# Patient Record
Sex: Female | Born: 1975 | Race: White | Hispanic: No | Marital: Married | Smoking: Never smoker
Health system: Southern US, Community
[De-identification: ages and names within clinical notes are randomized; demographics above are authoritative.]

## PROBLEM LIST (undated history)

## (undated) DIAGNOSIS — F319 Bipolar disorder, unspecified: Secondary | ICD-10-CM

## (undated) DIAGNOSIS — K5090 Crohn's disease, unspecified, without complications: Secondary | ICD-10-CM

## (undated) DIAGNOSIS — N200 Calculus of kidney: Secondary | ICD-10-CM

## (undated) HISTORY — PX: APPENDECTOMY: SHX54

---

## 1998-05-31 ENCOUNTER — Emergency Department (HOSPITAL_COMMUNITY): Admission: EM | Admit: 1998-05-31 | Discharge: 1998-05-31 | Payer: Self-pay | Admitting: Emergency Medicine

## 1998-05-31 ENCOUNTER — Encounter: Payer: Self-pay | Admitting: Emergency Medicine

## 1999-06-27 ENCOUNTER — Encounter: Payer: Self-pay | Admitting: Emergency Medicine

## 1999-06-27 ENCOUNTER — Emergency Department (HOSPITAL_COMMUNITY): Admission: EM | Admit: 1999-06-27 | Discharge: 1999-06-27 | Payer: Self-pay | Admitting: Emergency Medicine

## 2000-12-27 ENCOUNTER — Other Ambulatory Visit: Admission: RE | Admit: 2000-12-27 | Discharge: 2000-12-27 | Payer: Self-pay | Admitting: Obstetrics and Gynecology

## 2001-02-12 ENCOUNTER — Other Ambulatory Visit: Admission: RE | Admit: 2001-02-12 | Discharge: 2001-02-12 | Payer: Self-pay | Admitting: Obstetrics and Gynecology

## 2002-02-14 ENCOUNTER — Encounter (INDEPENDENT_AMBULATORY_CARE_PROVIDER_SITE_OTHER): Payer: Self-pay | Admitting: Specialist

## 2002-02-14 ENCOUNTER — Ambulatory Visit (HOSPITAL_COMMUNITY): Admission: RE | Admit: 2002-02-14 | Discharge: 2002-02-14 | Payer: Self-pay | Admitting: Gastroenterology

## 2010-03-04 ENCOUNTER — Emergency Department (HOSPITAL_BASED_OUTPATIENT_CLINIC_OR_DEPARTMENT_OTHER)
Admission: EM | Admit: 2010-03-04 | Discharge: 2010-03-04 | Payer: Self-pay | Source: Home / Self Care | Admitting: Emergency Medicine

## 2010-07-22 NOTE — Op Note (Signed)
NAMEDAJAHNAE, Laura Hebert                        ACCOUNT NO.:  [id number]   MEDICAL RECORD NO.:  [id number]                   PATIENT TYPE:  AMB   LOCATION:  ENDO                                 FACILITY:  [facility]   PHYSICIAN:  Petra Kuba, M.D.                 DATE OF BIRTH:  [birth year]   DATE OF PROCEDURE:  02/14/2002  DATE OF DISCHARGE:                                 OPERATIVE REPORT   PROCEDURE:  Colonoscopy.   INDICATIONS FOR PROCEDURE:  Crohns disease with some increased symptoms of  late. Want to reevaluate it.   Consent was signed after risks, benefits, methods, and options were  thoroughly discussed in the office.   MEDICINES USED:  Demerol 90, Versed 9.   DESCRIPTION OF PROCEDURE:  Rectal inspection was pertinent for external  hemorrhoids, small. Digital exam was negative. The pediatric video  adjustable colonoscope was inserted, easily advanced around the colon to the  cecum. This did require some lower abdominal pressure but no position  changes. On insertion beginning in the mid sigmoid, there was some  occasional scattered erosions and ulcers with some decreased vascular  pattern on the left side mostly in the descending but no other significant  findings. The cecum was identified by the appendiceal orifice the ileocecal  valve. In fact, the scope was inserted a short ways in the terminal ileum  which was normal. Photo documentation was obtained. The scope was slowly  withdrawn. The prep was adequate. There was some liquid stool that required  washing and suctioning on slow. A few TI scattered biopsies were obtained  and put in the first container, the scope was slowly withdrawn. There was  rare patches of erosions or ulcers primarily in the descending and sigmoid  but an occasional one in the ascending and transverse and multiple biopsies  were obtained. Also the slight decreased vascular pattern was confirmed in  the descending. No other significant abnormalities  were seen. The distal  sigmoid and rectum were normal. A few scattered biopsies of that area was  obtained and put in the third container. The scope was retroflexed pertinent  for some internal hemorrhoids. The scope was straightened and readvanced a  short ways up the left side of the colon, air was suctioned, scope removed.  The patient tolerated the procedure well. There was no obvious or immediate  complications.   ENDOSCOPIC DIAGNOSIS:  1. Internal and external small hemorrhoids.  2. Mild scattered erosions and ulcers throughout the mid sigmoid to the     ascending with decreased vascular pattern in the descending as well     status post multiple biopsies.  3. Otherwise within normal limits to the terminal ileum status post biopsy     with normal rectal and distal sigmoid status post biopsy.   PLAN:  Await pathology, increase Asacol to 3, three times a day. Call p.r.n.  Small bowel follow through probably  next p.r.n. and follow-up p.r.n. or in  three months to recheck symptoms and possibly retry lowering the dose.                                               Petra Kuba, M.D.    MEM/MEDQ  D:  02/14/2002  T:  02/14/2002  Job:  [id number]

## 2012-05-10 ENCOUNTER — Encounter (HOSPITAL_BASED_OUTPATIENT_CLINIC_OR_DEPARTMENT_OTHER): Payer: Self-pay | Admitting: Emergency Medicine

## 2012-05-10 ENCOUNTER — Emergency Department (HOSPITAL_BASED_OUTPATIENT_CLINIC_OR_DEPARTMENT_OTHER)
Admission: EM | Admit: 2012-05-10 | Discharge: 2012-05-10 | Disposition: A | Discharge status: Home / Self Care | Payer: Managed Care, Other (non HMO) | Attending: Emergency Medicine | Admitting: Emergency Medicine

## 2012-05-10 ENCOUNTER — Emergency Department (HOSPITAL_BASED_OUTPATIENT_CLINIC_OR_DEPARTMENT_OTHER): Payer: Managed Care, Other (non HMO)

## 2012-05-10 DIAGNOSIS — Z8719 Personal history of other diseases of the digestive system: Secondary | ICD-10-CM | POA: Insufficient documentation

## 2012-05-10 DIAGNOSIS — J3489 Other specified disorders of nose and nasal sinuses: Secondary | ICD-10-CM | POA: Insufficient documentation

## 2012-05-10 DIAGNOSIS — J40 Bronchitis, not specified as acute or chronic: Secondary | ICD-10-CM | POA: Insufficient documentation

## 2012-05-10 DIAGNOSIS — J069 Acute upper respiratory infection, unspecified: Secondary | ICD-10-CM | POA: Insufficient documentation

## 2012-05-10 HISTORY — DX: Crohn's disease, unspecified, without complications: K50.90

## 2012-05-10 MED ORDER — GUAIFENESIN-CODEINE 100-10 MG/5ML PO SYRP: 5.0000 mL | ORAL_SOLUTION | Freq: Three times a day (TID) | ORAL | Status: DC | PRN

## 2012-05-10 NOTE — ED Provider Notes (Signed)
History     CSN: [id number]  Arrival date & time 05/10/12  1623   First MD Initiated Contact with Patient 05/10/12 1642      Chief Complaint  Patient presents with  . Cough    (Consider location/radiation/quality/duration/timing/severity/associated sxs/prior treatment) HPI Comments: No leg pain or swelling.  No pleuritic pain.  No fevers.  Patient is a 37 y.o. female presenting with cough. The history is provided by the patient.  Cough Cough characteristics:  Non-productive, dry and hacking Severity:  Moderate Onset quality:  Gradual Duration:  1 week Timing:  Intermittent Progression:  Worsening Chronicity:  New Smoker: no   Context: upper respiratory infection   Context: not fumes and not sick contacts   Relieved by:  Nothing Worsened by:  Nothing tried Ineffective treatments: Nyquil. Associated symptoms: rhinorrhea and sinus congestion   Associated symptoms: no chest pain, no chills, no diaphoresis, no fever, no headaches, no rash and no shortness of breath     Past Medical History  Diagnosis Date  . Crohn's disease     Past Surgical History  Procedure Laterality Date  . Cesarean section    . Appendectomy      No family history on file.  History  Substance Use Topics  . Smoking status: Never Smoker   . Smokeless tobacco: Not on file  . Alcohol Use: Yes     Comment: socially    OB History   Grav Para Term Preterm Abortions TAB SAB Ect Mult Living                  Review of Systems  Constitutional: Negative for fever, chills, diaphoresis and fatigue.  HENT: Positive for rhinorrhea. Negative for congestion and sneezing.   Eyes: Negative.   Respiratory: Positive for cough. Negative for chest tightness and shortness of breath.   Cardiovascular: Negative for chest pain and leg swelling.  Gastrointestinal: Negative for nausea, vomiting, abdominal pain, diarrhea and blood in stool.  Genitourinary: Negative for frequency, hematuria, flank pain and  difficulty urinating.  Musculoskeletal: Negative for back pain and arthralgias.  Skin: Negative for rash.  Neurological: Negative for dizziness, speech difficulty, weakness, numbness and headaches.    Allergies  Penicillins  Home Medications   Current Outpatient Rx  Name  Route  Sig  Dispense  Refill  . guaiFENesin-codeine (ROBITUSSIN AC) 100-10 MG/5ML syrup   Oral   Take 5 mLs by mouth 3 (three) times daily as needed for cough.   120 mL   0     BP 139/85  Pulse 86  Temp(Src) 97.6 F (36.4 C) (Oral)  Ht 5\' 2"  (1.575 m)  Wt 162 lb (73.483 kg)  BMI 29.62 kg/m2  SpO2 97%  LMP 04/23/2012  Physical Exam  Constitutional: She is oriented to person, place, and time. She appears well-developed and well-nourished.  HENT:  Head: Normocephalic and atraumatic.  Right Ear: External ear normal.  Left Ear: External ear normal.  Mouth/Throat: Oropharynx is clear and moist.  Eyes: Pupils are equal, round, and reactive to light.  Neck: Normal range of motion. Neck supple.  Cardiovascular: Normal rate, regular rhythm and normal heart sounds.   Pulmonary/Chest: Effort normal. No respiratory distress. She has no wheezes. She has rales (few crackles right base). She exhibits no tenderness.  Abdominal: Soft. Bowel sounds are normal. There is no tenderness. There is no rebound and no guarding.  Musculoskeletal: Normal range of motion. She exhibits no edema.  Lymphadenopathy:    She has no cervical  adenopathy.  Neurological: She is alert and oriented to person, place, and time.  Skin: Skin is warm and dry. No rash noted.  Psychiatric: She has a normal mood and affect.    ED Course  Procedures (including critical care time)  No results found for this or any previous visit. Dg Chest 2 View  05/10/2012  *RADIOLOGY REPORT*  Clinical Data: Cough, congestion  CHEST - 2 VIEW  Comparison: None.  Findings: Lungs are clear. No pleural effusion or pneumothorax.  Cardiomediastinal silhouette is  within normal limits.  Visualized osseous structures are within normal limits.  IMPRESSION: Normal chest radiographs.   Original Report Authenticated By: Charline Bills, M.D.       1. Bronchitis       MDM  Patient with a one-week history of a nonproductive cough. She is well-appearing with new shortness of breath or hypoxia. She has no tachypnea and no evidence of pulmonary embolus. Her lungs were clear other than some few crackles in the right lung base which seem to clear with coughing. Her chest x-ray is negative for infiltrates. I feel this is likely a viral bronchitis. I did give her a prescription for Robitussin with codeine. I advised her to followup with her primary care physician or return to the emergency room if her symptoms worsen or are not improving.       Rolan Bucco, MD 05/10/12 1736

## 2012-05-10 NOTE — ED Notes (Signed)
NP cough x one week.  No n/v/d or fever.  Has taken Nyquil OTC without relief.

## 2013-08-06 DIAGNOSIS — F411 Generalized anxiety disorder: Secondary | ICD-10-CM | POA: Diagnosis present

## 2014-09-30 DIAGNOSIS — F329 Major depressive disorder, single episode, unspecified: Secondary | ICD-10-CM | POA: Insufficient documentation

## 2017-06-14 ENCOUNTER — Emergency Department (HOSPITAL_BASED_OUTPATIENT_CLINIC_OR_DEPARTMENT_OTHER): Payer: Managed Care, Other (non HMO)

## 2017-06-14 ENCOUNTER — Encounter (HOSPITAL_BASED_OUTPATIENT_CLINIC_OR_DEPARTMENT_OTHER): Payer: Self-pay | Admitting: *Deleted

## 2017-06-14 ENCOUNTER — Emergency Department (HOSPITAL_BASED_OUTPATIENT_CLINIC_OR_DEPARTMENT_OTHER)
Admission: EM | Admit: 2017-06-14 | Discharge: 2017-06-14 | Disposition: A | Discharge status: Home / Self Care | Payer: Managed Care, Other (non HMO) | Attending: Emergency Medicine | Admitting: Emergency Medicine

## 2017-06-14 ENCOUNTER — Other Ambulatory Visit: Payer: Self-pay

## 2017-06-14 DIAGNOSIS — Z79899 Other long term (current) drug therapy: Secondary | ICD-10-CM | POA: Insufficient documentation

## 2017-06-14 DIAGNOSIS — R197 Diarrhea, unspecified: Secondary | ICD-10-CM | POA: Insufficient documentation

## 2017-06-14 DIAGNOSIS — R109 Unspecified abdominal pain: Secondary | ICD-10-CM | POA: Diagnosis present

## 2017-06-14 DIAGNOSIS — N201 Calculus of ureter: Secondary | ICD-10-CM

## 2017-06-14 DIAGNOSIS — R112 Nausea with vomiting, unspecified: Secondary | ICD-10-CM | POA: Diagnosis not present

## 2017-06-14 DIAGNOSIS — N132 Hydronephrosis with renal and ureteral calculous obstruction: Secondary | ICD-10-CM | POA: Insufficient documentation

## 2017-06-14 LAB — COMPREHENSIVE METABOLIC PANEL
ALBUMIN: 3.6 g/dL (ref 3.5–5.0)
ALK PHOS: 96 U/L (ref 38–126)
ALT: 8 U/L — ABNORMAL LOW (ref 14–54)
ANION GAP: 8 (ref 5–15)
AST: 14 U/L — AB (ref 15–41)
BUN: 10 mg/dL (ref 6–20)
CALCIUM: 8.3 mg/dL — AB (ref 8.9–10.3)
CO2: 20 mmol/L — AB (ref 22–32)
Chloride: 108 mmol/L (ref 101–111)
Creatinine, Ser: 0.91 mg/dL (ref 0.44–1.00)
GFR calc Af Amer: >60 mL/min (ref >60)
GFR calc non Af Amer: >60 mL/min (ref >60)
Glucose, Bld: 124 mg/dL — ABNORMAL HIGH (ref 65–99)
POTASSIUM: 3.3 mmol/L — AB (ref 3.5–5.1)
SODIUM: 136 mmol/L (ref 135–145)
Total Bilirubin: 0.1 mg/dL — ABNORMAL LOW (ref 0.3–1.2)
Total Protein: 7.2 g/dL (ref 6.5–8.1)

## 2017-06-14 LAB — URINALYSIS, ROUTINE W REFLEX MICROSCOPIC
BILIRUBIN URINE: NEGATIVE
Glucose, UA: NEGATIVE mg/dL
Ketones, ur: 15 mg/dL — AB
LEUKOCYTES UA: NEGATIVE
NITRITE: NEGATIVE
PH: 6.5 (ref 5.0–8.0)
PROTEIN: NEGATIVE mg/dL
Specific Gravity, Urine: 1.025 (ref 1.005–1.030)

## 2017-06-14 LAB — CBC WITH DIFFERENTIAL/PLATELET
BASOS ABS: 0 10*3/uL (ref 0.0–0.1)
BASOS PCT: 0 %
EOS ABS: 0.1 10*3/uL (ref 0.0–0.7)
Eosinophils Relative: 1 %
HCT: 30.7 % — ABNORMAL LOW (ref 36.0–46.0)
HEMOGLOBIN: 9.7 g/dL — AB (ref 12.0–15.0)
Lymphocytes Relative: 6 %
Lymphs Abs: 0.6 10*3/uL — ABNORMAL LOW (ref 0.7–4.0)
MCH: 24.9 pg — ABNORMAL LOW (ref 26.0–34.0)
MCHC: 31.6 g/dL (ref 30.0–36.0)
MCV: 78.7 fL (ref 78.0–100.0)
MONOS PCT: 4 %
Monocytes Absolute: 0.4 10*3/uL (ref 0.1–1.0)
NEUTROS PCT: 89 %
Neutro Abs: 8.6 10*3/uL — ABNORMAL HIGH (ref 1.7–7.7)
Platelets: 340 10*3/uL (ref 150–400)
RBC: 3.9 MIL/uL (ref 3.87–5.11)
RDW: 14.9 % (ref 11.5–15.5)
WBC: 9.6 10*3/uL (ref 4.0–10.5)

## 2017-06-14 LAB — LIPASE, BLOOD: Lipase: 27 U/L (ref 11–51)

## 2017-06-14 LAB — PREGNANCY, URINE: PREG TEST UR: NEGATIVE

## 2017-06-14 LAB — I-STAT CG4 LACTIC ACID, ED: Lactic Acid, Venous: 1.27 mmol/L (ref 0.5–1.9)

## 2017-06-14 LAB — URINALYSIS, MICROSCOPIC (REFLEX)

## 2017-06-14 MED ORDER — HYDROMORPHONE HCL 1 MG/ML IJ SOLN
1.0000 mg | Freq: Once | INTRAMUSCULAR | Status: AC
  Administered 2017-06-14: 1 mg via INTRAVENOUS
  Filled 2017-06-14: qty 1

## 2017-06-14 MED ORDER — ONDANSETRON HCL 4 MG/2ML IJ SOLN
4.0000 mg | Freq: Once | INTRAMUSCULAR | Status: AC
  Administered 2017-06-14: 4 mg via INTRAVENOUS
  Filled 2017-06-14: qty 2

## 2017-06-14 MED ORDER — IOPAMIDOL (ISOVUE-300) INJECTION 61%
100.0000 mL | Freq: Once | INTRAVENOUS | Status: AC | PRN
  Administered 2017-06-14: 100 mL via INTRAVENOUS

## 2017-06-14 MED ORDER — MORPHINE SULFATE (PF) 4 MG/ML IV SOLN
4.0000 mg | Freq: Once | INTRAVENOUS | Status: AC
  Administered 2017-06-14: 4 mg via INTRAVENOUS
  Filled 2017-06-14: qty 1

## 2017-06-14 MED ORDER — TAMSULOSIN HCL 0.4 MG PO CAPS: 0.4000 mg | ORAL_CAPSULE | Freq: Every day | ORAL | 0 refills | Status: DC

## 2017-06-14 MED ORDER — ONDANSETRON 4 MG PO TBDP: 4.0000 mg | ORAL_TABLET | Freq: Three times a day (TID) | ORAL | 0 refills | Status: DC | PRN

## 2017-06-14 MED ORDER — IBUPROFEN 800 MG PO TABS: 800.0000 mg | ORAL_TABLET | Freq: Three times a day (TID) | ORAL | 0 refills | Status: DC

## 2017-06-14 MED ORDER — KETOROLAC TROMETHAMINE 30 MG/ML IJ SOLN
30.0000 mg | Freq: Once | INTRAMUSCULAR | Status: AC
  Administered 2017-06-14: 30 mg via INTRAVENOUS
  Filled 2017-06-14: qty 1

## 2017-06-14 MED ORDER — LORAZEPAM 2 MG/ML IJ SOLN
1.0000 mg | Freq: Once | INTRAMUSCULAR | Status: AC
  Administered 2017-06-14: 1 mg via INTRAVENOUS
  Filled 2017-06-14: qty 1

## 2017-06-14 NOTE — ED Triage Notes (Addendum)
Pt c/o right lower abd pain which radiates to right flank, n/v/d. Pt is pale in color and  Diaphoretic.

## 2017-06-14 NOTE — ED Notes (Signed)
Pt vomited most of 1 bottle of CT oral contrast; EDP aware; will proceed to CT without further oral contrast; CT made aware.

## 2017-06-14 NOTE — ED Provider Notes (Signed)
[facility] EMERGENCY DEPARTMENT Provider Note   CSN: [id number] Arrival date & time: 06/14/17  1348     History   Chief Complaint Chief Complaint  Patient presents with  . Abdominal Pain    HPI Laura Hebert is a 42 y.o. female with a story of Crohn's and disease and nephrolithiasis who presents to the emergency department with a chief complaint of abdominal pain, nausea, vomiting, diarrhea.  Patient endorses sudden onset, severe right lower quadrant pain that began 2-3 hours prior to arrival.  Pain is constant, worsening, and characterized as stabbing.  No aggravating or alleviating factors.  Pain was initially non-radiating, but is now radiating to the right low back. The patient is unable to find a comfortable position.  No history of similar pain.  She also endorses 2 episodes of NBNB emesis and multiple episodes of dry heaving.  Of chronic diarrhea.  She denies a fever, chills, dysuria, frequency, hematuria, melena, hematochezia, vaginal pain or discharge, rash, dyspnea, or CP.   Surgical history includes cesarean section and appendectomy.  She reports that her Crohn's has been in remission for many years   And does not take any medications for this, but had previously taken Remicade injections.  She treated her symptoms with a heating pad prior to arrival with no improvement.  No known sick contacts.  The history is provided by the patient. No language interpreter was used.  Abdominal Pain   Associated symptoms include diarrhea (chronic), nausea and vomiting. Pertinent negatives include fever, dysuria and arthralgias.    Past Medical History:  Diagnosis Date  . Crohn's disease (HCC)     There are no active problems to display for this patient.   Past Surgical History:  Procedure Laterality Date  . APPENDECTOMY    . CESAREAN SECTION       OB History   None      Home Medications    Prior to Admission medications   Medication Sig Start Date End  Date Taking? Authorizing Provider  carbamazepine (EQUETRO) 200 MG CP12 12 hr capsule Take 200 mg by mouth.   Yes [provider]  lamoTRIgine (LAMICTAL) 100 MG tablet Take 100 mg by mouth daily.   Yes [provider]  guaiFENesin-codeine (ROBITUSSIN AC) 100-10 MG/5ML syrup Take 5 mLs by mouth 3 (three) times daily as needed for cough. 05/10/12   Rolan Bucco, MD  ibuprofen (ADVIL,MOTRIN) 800 MG tablet Take 1 tablet (800 mg total) by mouth 3 (three) times daily. 06/14/17   Jahnaya Branscome A, PA-C  ondansetron (ZOFRAN ODT) 4 MG disintegrating tablet Take 1 tablet (4 mg total) by mouth every 8 (eight) hours as needed for nausea or vomiting. 06/14/17   Janalyn Higby A, PA-C  tamsulosin (FLOMAX) 0.4 MG CAPS capsule Take 1 capsule (0.4 mg total) by mouth daily. 06/14/17   Arshad Oberholzer, Coral Else, PA-C    Family History History reviewed. No pertinent family history.  Social History Social History   Tobacco Use  . Smoking status: Never Smoker  . Smokeless tobacco: Never Used  Substance Use Topics  . Alcohol use: Yes    Comment: socially  . Drug use: No     Allergies   Penicillins   Review of Systems Review of Systems  Constitutional: Negative for activity change, chills and fever.  HENT: Negative for congestion.   Eyes: Negative for visual disturbance.  Respiratory: Negative for shortness of breath.   Cardiovascular: Negative for chest pain.  Gastrointestinal: Positive for abdominal pain, diarrhea (  chronic), nausea and vomiting.  Genitourinary: Negative for dysuria, urgency, vaginal discharge and vaginal pain.  Musculoskeletal: Positive for back pain. Negative for arthralgias and neck pain.  Skin: Negative for rash.  Allergic/Immunologic: Positive for immunocompromised state.  Neurological: Negative for weakness.     Physical Exam Updated Vital Signs BP 130/82 (BP Location: Right Arm)   Pulse 69   Temp 98.3 F (36.8 C)   Resp 18   Ht 5\' 2"  (1.575 m)   Wt 77.1 kg (170  lb)   LMP 05/30/2017   SpO2 100%   BMI 31.09 kg/m   Physical Exam  Constitutional:  Uncomfortable appearing and writhing around on the bed  HENT:  Head: Normocephalic.  Eyes: Conjunctivae are normal.  Neck: Neck supple.  Cardiovascular: Normal rate, regular rhythm, normal heart sounds and intact distal pulses. Exam reveals no gallop and no friction rub.  No murmur heard. Pulmonary/Chest: Effort normal. No respiratory distress.  Abdominal: Soft. She exhibits no distension and no mass. There is tenderness. There is guarding. There is no rebound. No hernia.  Abdomen is soft, nondistended.  She is diffusely tender in all 4 quadrants with some guarding.  She is moderately tender with increased guarding in the right lower quadrant, right flank, and right CVA.  No left CVA tenderness.  No rebound tenderness.  Normoactive bowel sounds.  Neurological: She is alert.  Skin: Skin is warm. Capillary refill takes less than 2 seconds. No rash noted.  Psychiatric: Her behavior is normal.  Nursing note and vitals reviewed.    ED Treatments / Results  Labs (all labs ordered are listed, but only abnormal results are displayed) Labs Reviewed  URINALYSIS, ROUTINE W REFLEX MICROSCOPIC - Abnormal; Notable for the following components:      Result Value   Hgb urine dipstick MODERATE (*)    Ketones, ur 15 (*)    All other components within normal limits  CBC WITH DIFFERENTIAL/PLATELET - Abnormal; Notable for the following components:   Hemoglobin 9.7 (*)    HCT 30.7 (*)    MCH 24.9 (*)    Neutro Abs 8.6 (*)    Lymphs Abs 0.6 (*)    All other components within normal limits  COMPREHENSIVE METABOLIC PANEL - Abnormal; Notable for the following components:   Potassium 3.3 (*)    CO2 20 (*)    Glucose, Bld 124 (*)    Calcium 8.3 (*)    AST 14 (*)    ALT 8 (*)    Total Bilirubin 0.1 (*)    All other components within normal limits  URINALYSIS, MICROSCOPIC (REFLEX) - Abnormal; Notable for the  following components:   Bacteria, UA RARE (*)    Squamous Epithelial / LPF 0-5 (*)    All other components within normal limits  PREGNANCY, URINE  LIPASE, BLOOD  I-STAT CG4 LACTIC ACID, ED    EKG None  Radiology Ct Abdomen Pelvis W Contrast  Result Date: 06/14/2017 CLINICAL DATA:  Right flank pain common nausea, vomiting and diarrhea. EXAM: CT ABDOMEN AND PELVIS WITH CONTRAST TECHNIQUE: Multidetector CT imaging of the abdomen and pelvis was performed using the standard protocol following bolus administration of intravenous contrast. CONTRAST:  ISOVUE-300 IOPAMIDOL (ISOVUE-300) INJECTION 61% COMPARISON:  None available FINDINGS: Lower chest: The lung bases are clear of acute process. Minimal streaky basilar atelectasis. No pleural effusion or pulmonary lesions. The heart is normal in size. No pericardial effusion. The distal esophagus and aorta are unremarkable. Hepatobiliary: No focal hepatic lesions or  intrahepatic biliary dilatation. The gallbladder is normal. No common bile duct dilatation. Pancreas: No mass, inflammation or ductal dilatation. Spleen: Normal size.  No focal lesions. Adrenals/Urinary Tract: The adrenal glands are normal. The left kidney is normal. No renal, ureteral or bladder calculi. No renal lesions. The right kidney demonstrates marked hydronephrosis and extensive perinephric edema/fluid consistent with high-grade obstruction. There is a 3 mm calculus located at the iliac crest level causing the obstruction. No more distal ureteral calculi and no bladder calculi. Stomach/Bowel: The stomach, duodenum, small bowel and colon are grossly normal without oral contrast. No inflammatory changes, mass lesions or obstructive findings. The terminal ileum is normal. The appendix is surgically absent. Vascular/Lymphatic: The aorta is normal in caliber. No dissection. The branch vessels are patent. The major venous structures are patent. No mesenteric or retroperitoneal mass or  adenopathy. Small scattered lymph nodes are noted. Reproductive: The uterus and ovaries are unremarkable bilateral ovarian cysts are noted. Other: No pelvic mass or adenopathy. No free pelvic fluid collections. No inguinal mass or adenopathy. No abdominal wall hernia or subcutaneous lesions. Musculoskeletal: No significant bony findings. IMPRESSION: 3 mm right mid ureteral calculus causing high-grade obstruction with right-sided hydroureteronephrosis. Electronically Signed   By: Rudie Meyer M.D.   On: 06/14/2017 17:51    Procedures Procedures (including critical care time)  Medications Ordered in ED Medications  HYDROmorphone (DILAUDID) injection 1 mg (1 mg Intravenous Given 06/14/17 1424)  ondansetron (ZOFRAN) injection 4 mg (4 mg Intravenous Given 06/14/17 1422)  ondansetron (ZOFRAN) injection 4 mg (4 mg Intravenous Given 06/14/17 1526)  iopamidol (ISOVUE-300) 61 % injection 100 mL (100 mLs Intravenous Contrast Given 06/14/17 1713)  morphine 4 MG/ML injection 4 mg (4 mg Intravenous Given 06/14/17 1634)  LORazepam (ATIVAN) injection 1 mg (1 mg Intravenous Given 06/14/17 1739)  ketorolac (TORADOL) 30 MG/ML injection 30 mg (30 mg Intravenous Given 06/14/17 1810)     Initial Impression / Assessment and Plan / ED Course  I have reviewed the triage vital signs and the nursing notes.  Pertinent labs & imaging results that were available during my care of the patient were reviewed by me and considered in my medical decision making (see chart for details).  Clinical Course as of Jun 14 1929  Thu Jun 14, 2017  1622 Patient recheck. Pain has not improved. Morphine ordered.    [MM]    Clinical Course User Index [MM] Jeancarlo Leffler A, PA-C    42 year old female with history of Crohn's and disease and nephrolithiasis who presents to the emergency department with a chief complaint of a N/V and RLQ pain.  Hemoglobin is 9.7, patient's husband reports this is close to her baseline from previous lab work  performed by her psychiatrist. Ddx includes Crohn's flare, nephrolithiasis, ovarian torsion, or adhesion secondary to previous appendectomy.  CT scan with 3 mm ureterolithiasis and hydronephrosis.  Urinalysis not concerning for infection.  She has no fever or other constitutional symptoms.  She was initially given morphine and Dilaudid for pain control.  Pain resolved with Toradol.  Nausea vomiting resolved with Zofran.  She was successfully fluid challenged.  We will discharge the patient home with NSAIDs, Zofran, and Flomax with urology follow-up.  Strict return precautions given.  She is hemodynamically stable and in no acute distress.  She is safe for discharge home at this time.  Final Clinical Impressions(s) / ED Diagnoses   Final diagnoses:  Ureterolithiasis    ED Discharge Orders        Ordered  ondansetron (ZOFRAN ODT) 4 MG disintegrating tablet  Every 8 hours PRN     06/14/17 1923    ibuprofen (ADVIL,MOTRIN) 800 MG tablet  3 times daily     06/14/17 1923    tamsulosin (FLOMAX) 0.4 MG CAPS capsule  Daily     06/14/17 1924       Tramell Piechota, Coral Else, PA-C 06/14/17 1931    Arby Barrette, MD 06/15/17 1544

## 2017-06-14 NOTE — Discharge Instructions (Signed)
Take 800 mg of ibuprofen with food every 8 hours as needed for pain control.  You can also take 650 mg of Tylenol once every 6 hours in addition to ibuprofen.  Take 1 tablet of tamsulosin daily until you are seen by urology.  Let 1 tablet of Zofran dissolve under tongue every 8 hours as needed for nausea and vomiting.  Call urology tomorrow to schedule a follow-up appointment from your visit today.  If you develop any new or worsening symptoms including uncontrollable pain, vomiting despite taking Zofran, if you become unable to urinate, or other new concerning symptoms, please return to the emergency department for re-evaluation.

## 2020-05-12 ENCOUNTER — Emergency Department (HOSPITAL_BASED_OUTPATIENT_CLINIC_OR_DEPARTMENT_OTHER)
Admission: EM | Admit: 2020-05-12 | Discharge: 2020-05-12 | Disposition: A | Discharge status: Home / Self Care | Payer: Managed Care, Other (non HMO) | Attending: Emergency Medicine | Admitting: Emergency Medicine

## 2020-05-12 ENCOUNTER — Encounter (HOSPITAL_BASED_OUTPATIENT_CLINIC_OR_DEPARTMENT_OTHER): Payer: Self-pay

## 2020-05-12 ENCOUNTER — Emergency Department (HOSPITAL_BASED_OUTPATIENT_CLINIC_OR_DEPARTMENT_OTHER): Payer: Managed Care, Other (non HMO)

## 2020-05-12 DIAGNOSIS — N200 Calculus of kidney: Secondary | ICD-10-CM

## 2020-05-12 DIAGNOSIS — K8020 Calculus of gallbladder without cholecystitis without obstruction: Secondary | ICD-10-CM | POA: Diagnosis not present

## 2020-05-12 DIAGNOSIS — N132 Hydronephrosis with renal and ureteral calculous obstruction: Secondary | ICD-10-CM | POA: Insufficient documentation

## 2020-05-12 DIAGNOSIS — R109 Unspecified abdominal pain: Secondary | ICD-10-CM | POA: Diagnosis present

## 2020-05-12 HISTORY — DX: Calculus of kidney: N20.0

## 2020-05-12 HISTORY — DX: Bipolar disorder, unspecified: F31.9

## 2020-05-12 LAB — COMPREHENSIVE METABOLIC PANEL
ALT: 9 U/L (ref 0–44)
AST: 11 U/L — ABNORMAL LOW (ref 15–41)
Albumin: 3.6 g/dL (ref 3.5–5.0)
Alkaline Phosphatase: 101 U/L (ref 38–126)
Anion gap: 8 (ref 5–15)
BUN: 12 mg/dL (ref 6–20)
CO2: 26 mmol/L (ref 22–32)
Calcium: 8.7 mg/dL — ABNORMAL LOW (ref 8.9–10.3)
Chloride: 103 mmol/L (ref 98–111)
Creatinine, Ser: 0.92 mg/dL (ref 0.44–1.00)
GFR, Estimated: >60 mL/min (ref >60)
Glucose, Bld: 93 mg/dL (ref 70–99)
Potassium: 4 mmol/L (ref 3.5–5.1)
Sodium: 137 mmol/L (ref 135–145)
Total Bilirubin: 0.2 mg/dL — ABNORMAL LOW (ref 0.3–1.2)
Total Protein: 7.1 g/dL (ref 6.5–8.1)

## 2020-05-12 LAB — URINALYSIS, ROUTINE W REFLEX MICROSCOPIC
Bilirubin Urine: NEGATIVE
Glucose, UA: NEGATIVE mg/dL
Ketones, ur: NEGATIVE mg/dL
Nitrite: NEGATIVE
Protein, ur: 30 mg/dL — AB
Specific Gravity, Urine: 1.025 (ref 1.005–1.030)
pH: 6.5 (ref 5.0–8.0)

## 2020-05-12 LAB — PREGNANCY, URINE: Preg Test, Ur: NEGATIVE

## 2020-05-12 LAB — CBC WITH DIFFERENTIAL/PLATELET
Abs Immature Granulocytes: 0.03 10*3/uL (ref 0.00–0.07)
Basophils Absolute: 0 10*3/uL (ref 0.0–0.1)
Basophils Relative: 1 %
Eosinophils Absolute: 0.1 10*3/uL (ref 0.0–0.5)
Eosinophils Relative: 1 %
HCT: 32 % — ABNORMAL LOW (ref 36.0–46.0)
Hemoglobin: 10 g/dL — ABNORMAL LOW (ref 12.0–15.0)
Immature Granulocytes: 0 %
Lymphocytes Relative: 7 %
Lymphs Abs: 0.6 10*3/uL — ABNORMAL LOW (ref 0.7–4.0)
MCH: 24.9 pg — ABNORMAL LOW (ref 26.0–34.0)
MCHC: 31.3 g/dL (ref 30.0–36.0)
MCV: 79.6 fL — ABNORMAL LOW (ref 80.0–100.0)
Monocytes Absolute: 0.5 10*3/uL (ref 0.1–1.0)
Monocytes Relative: 6 %
Neutro Abs: 7.4 10*3/uL (ref 1.7–7.7)
Neutrophils Relative %: 85 %
Platelets: 379 10*3/uL (ref 150–400)
RBC: 4.02 MIL/uL (ref 3.87–5.11)
RDW: 16.2 % — ABNORMAL HIGH (ref 11.5–15.5)
WBC: 8.7 10*3/uL (ref 4.0–10.5)
nRBC: 0 % (ref 0.0–0.2)

## 2020-05-12 LAB — URINALYSIS, MICROSCOPIC (REFLEX): RBC / HPF: >50 RBC/hpf (ref 0–5)

## 2020-05-12 LAB — LIPASE, BLOOD: Lipase: 38 U/L (ref 11–51)

## 2020-05-12 MED ORDER — CEPHALEXIN 500 MG PO CAPS: 500.0000 mg | ORAL_CAPSULE | Freq: Two times a day (BID) | ORAL | 0 refills | Status: AC

## 2020-05-12 MED ORDER — ONDANSETRON 4 MG PO TBDP: 4.0000 mg | ORAL_TABLET | Freq: Three times a day (TID) | ORAL | 0 refills | Status: DC | PRN

## 2020-05-12 MED ORDER — FENTANYL CITRATE (PF) 100 MCG/2ML IJ SOLN
50.0000 ug | Freq: Once | INTRAMUSCULAR | Status: AC
  Administered 2020-05-12: 50 ug via INTRAVENOUS
  Filled 2020-05-12: qty 2

## 2020-05-12 MED ORDER — TAMSULOSIN HCL 0.4 MG PO CAPS: 0.4000 mg | ORAL_CAPSULE | Freq: Every day | ORAL | 0 refills | Status: DC

## 2020-05-12 MED ORDER — CEPHALEXIN 250 MG PO CAPS
500.0000 mg | ORAL_CAPSULE | Freq: Once | ORAL | Status: AC
  Administered 2020-05-12: 500 mg via ORAL
  Filled 2020-05-12: qty 2

## 2020-05-12 MED ORDER — SODIUM CHLORIDE 0.9 % IV BOLUS
500.0000 mL | Freq: Once | INTRAVENOUS | Status: AC
  Administered 2020-05-12: 500 mL via INTRAVENOUS

## 2020-05-12 MED ORDER — HYDROCODONE-ACETAMINOPHEN 5-325 MG PO TABS: 1.0000 | ORAL_TABLET | Freq: Four times a day (QID) | ORAL | 0 refills | Status: DC | PRN

## 2020-05-12 MED ORDER — KETOROLAC TROMETHAMINE 15 MG/ML IJ SOLN
15.0000 mg | Freq: Once | INTRAMUSCULAR | Status: AC
  Administered 2020-05-12: 15 mg via INTRAVENOUS
  Filled 2020-05-12: qty 1

## 2020-05-12 NOTE — Discharge Instructions (Addendum)
At this time there does not appear to be the presence of an emergent medical condition, however there is always the potential for conditions to change. Please read and follow the below instructions.  Please return to the Emergency Department immediately for any new or worsening symptoms or if your symptoms do not improve within 2 days. Please be sure to follow up with your Primary Care Provider within one week regarding your visit today; please call their office to schedule an appointment even if you are feeling better for a follow-up visit. Please call the urologist Dr. Laverle Patter on your discharge paperwork to schedule follow-up appointment for further evaluation and treatment of your kidney stone disease. Your CT scan today also showed gallstones, please discuss this with your primary care provider at your follow-up visit. You have been given an NSAID-containing medication called Toradol today.  Do not take the medications including ibuprofen, Aleve, Advil, naproxen or other NSAID-containing medications for the next 2 days.  Please be sure to drink plenty of water over the next few days. Please use the medication Flomax as prescribed to help facilitate stone passage. You may use the nausea medication Zofran as prescribed to help with nausea and vomiting. You may take the medication Norco (Hydrocodone/Acetaminophen) as prescribed to help with severe pain.  This medication will make you drowsy so do not drive, drink alcohol, take other sedating medications or perform any dangerous activities such as driving after taking Norco. Norco contains Tylenol (acetaminophen) so do not take any other Tylenol-containing products with Norco. You have been started on Keflex to treat possible urinary tract infection.  Your urine was sent for culture if it grows out bacteria that require a different antibiotic you will be contacted by [facility] health with a new prescription.   Go to the nearest Emergency Department  immediately if: You have fever or chills You get very bad pain. You get new pain in your belly (abdomen). You pass out (faint). You cannot pee. You have any new/concerning or worsening of symptoms   Please read the additional information packets attached to your discharge summary.  Do not take your medicine if  develop an itchy rash, swelling in your mouth or lips, or difficulty breathing; call 911 and seek immediate emergency medical attention if this occurs.  You may review your lab tests and imaging results in their entirety on your MyChart account.  Please discuss all results of fully with your primary care provider and other specialist at your follow-up visit.  Note: Portions of this text may have been transcribed using voice recognition software. Every effort was made to ensure accuracy; however, inadvertent computerized transcription errors may still be present.

## 2020-05-12 NOTE — ED Notes (Signed)
Pt states unable to obtain urine sample at this time. 

## 2020-05-12 NOTE — ED Triage Notes (Signed)
Pt c/o right flank pain x 2.5 hours-states feels like kidney stone pain-NAD-steady gait

## 2020-05-12 NOTE — ED Provider Notes (Signed)
[facility] EMERGENCY DEPARTMENT Provider Note   CSN: [id number] Arrival date & time: 05/12/20  1441     History Chief Complaint  Patient presents with  . Flank Pain    Laura Hebert is a 45 y.o. female history of Crohn's disease, bipolar, kidney stone disease.  Patient presents today for right flank pain onset 2-3 hours prior to arrival described as severe intermittent occasionally radiating to her lower abdomen no clear inciting event.  There are no clear aggravating or alleviating factors.  Patient reports pain feels very similar to prior kidney stone around 3 years ago  Denies fever/chills, fall/injury, headache, chest pain/shortness of breath, dysuria/hematuria, vaginal bleeding/discharge, abnormal diarrhea, bloody stools or any additional concerns  HPI     Past Medical History:  Diagnosis Date  . Bipolar disorder (HCC)   . Crohn's disease (HCC)   . Kidney stone     There are no problems to display for this patient.   Past Surgical History:  Procedure Laterality Date  . APPENDECTOMY    . CESAREAN SECTION       OB History   No obstetric history on file.     No family history on file.  Social History   Tobacco Use  . Smoking status: Never Smoker  . Smokeless tobacco: Never Used  Vaping Use  . Vaping Use: Never used  Substance Use Topics  . Alcohol use: Yes    Comment: socially  . Drug use: No    Home Medications Prior to Admission medications   Medication Sig Start Date End Date Taking? Authorizing Provider  cephALEXin (KEFLEX) 500 MG capsule Take 1 capsule (500 mg total) by mouth 2 (two) times daily for 7 days. 05/12/20 05/19/20 Yes Harlene Salts A, PA-C  HYDROcodone-acetaminophen (NORCO/VICODIN) 5-325 MG tablet Take 1 tablet by mouth every 6 (six) hours as needed for severe pain. 05/12/20  Yes Harlene Salts A, PA-C  ondansetron (ZOFRAN ODT) 4 MG disintegrating tablet Take 1 tablet (4 mg total) by mouth every 8 (eight) hours as needed  for nausea or vomiting. 05/12/20  Yes Harlene Salts A, PA-C  tamsulosin (FLOMAX) 0.4 MG CAPS capsule Take 1 capsule (0.4 mg total) by mouth daily. 05/12/20  Yes Harlene Salts A, PA-C  carbamazepine (EQUETRO) 200 MG CP12 12 hr capsule Take 200 mg by mouth.    [provider]  guaiFENesin-codeine (ROBITUSSIN AC) 100-10 MG/5ML syrup Take 5 mLs by mouth 3 (three) times daily as needed for cough. 05/10/12   Rolan Bucco, MD  ibuprofen (ADVIL,MOTRIN) 800 MG tablet Take 1 tablet (800 mg total) by mouth 3 (three) times daily. 06/14/17   McDonald, Mia A, PA-C  lamoTRIgine (LAMICTAL) 100 MG tablet Take 100 mg by mouth daily.    [provider]    Allergies    Penicillins  Review of Systems   Review of Systems Ten systems are reviewed and are negative for acute change except as noted in the HPI  Physical Exam Updated Vital Signs BP (!) 130/94 (BP Location: Left Arm)   Pulse 85   Temp 97.9 F (36.6 C) (Oral)   Resp 18   Ht 5\' 1"  (1.549 m)   Wt 78.7 kg   LMP 04/28/2020   SpO2 100%   BMI 32.78 kg/m   Physical Exam Constitutional:      General: She is not in acute distress.    Appearance: Normal appearance. She is well-developed. She is not ill-appearing or diaphoretic.  HENT:     Head:  Normocephalic and atraumatic.  Eyes:     General: Vision grossly intact. Gaze aligned appropriately.     Pupils: Pupils are equal, round, and reactive to light.  Neck:     Trachea: Trachea and phonation normal.  Cardiovascular:     Rate and Rhythm: Normal rate and regular rhythm.     Pulses:          Dorsalis pedis pulses are 2+ on the right side and 2+ on the left side.  Pulmonary:     Effort: Pulmonary effort is normal. No respiratory distress.  Abdominal:     General: There is no distension.     Palpations: Abdomen is soft.     Tenderness: There is no abdominal tenderness. There is no guarding or rebound.  Musculoskeletal:        General: Normal range of motion.     Cervical  back: Normal range of motion.  Feet:     Right foot:     Protective Sensation: 3 sites tested. 3 sites sensed.     Left foot:     Protective Sensation: 3 sites tested. 3 sites sensed.  Skin:    General: Skin is warm and dry.  Neurological:     Mental Status: She is alert.     GCS: GCS eye subscore is 4. GCS verbal subscore is 5. GCS motor subscore is 6.     Comments: Speech is clear and goal oriented, follows commands Major Cranial nerves without deficit, no facial droop Moves extremities without ataxia, coordination intact  Psychiatric:        Behavior: Behavior normal.     ED Results / Procedures / Treatments   Labs (all labs ordered are listed, but only abnormal results are displayed) Labs Reviewed  URINALYSIS, ROUTINE W REFLEX MICROSCOPIC - Abnormal; Notable for the following components:      Result Value   APPearance CLOUDY (*)    Hgb urine dipstick LARGE (*)    Protein, ur 30 (*)    Leukocytes,Ua TRACE (*)    All other components within normal limits  CBC WITH DIFFERENTIAL/PLATELET - Abnormal; Notable for the following components:   Hemoglobin 10.0 (*)    HCT 32.0 (*)    MCV 79.6 (*)    MCH 24.9 (*)    RDW 16.2 (*)    Lymphs Abs 0.6 (*)    All other components within normal limits  COMPREHENSIVE METABOLIC PANEL - Abnormal; Notable for the following components:   Calcium 8.7 (*)    AST 11 (*)    Total Bilirubin 0.2 (*)    All other components within normal limits  URINALYSIS, MICROSCOPIC (REFLEX) - Abnormal; Notable for the following components:   Bacteria, UA MANY (*)    All other components within normal limits  URINE CULTURE  PREGNANCY, URINE  LIPASE, BLOOD    EKG None  Radiology CT Renal Stone Study  Result Date: 05/12/2020 CLINICAL DATA:  Right-sided flank pain for several hours EXAM: CT ABDOMEN AND PELVIS WITHOUT CONTRAST TECHNIQUE: Multidetector CT imaging of the abdomen and pelvis was performed following the standard protocol without IV contrast.  COMPARISON:  06/14/2017 FINDINGS: Lower chest: No acute abnormality. Hepatobiliary: Gallbladder is decompressed with density within likely representing small gallstones. The liver is within normal limits. Pancreas: Unremarkable. No pancreatic ductal dilatation or surrounding inflammatory changes. Spleen: Normal in size without focal abnormality. Adrenals/Urinary Tract: Adrenal glands are unremarkable. Kidneys are well visualized bilaterally. Right-sided hydronephrosis and hydroureter is noted which extends to the  level of the right UVJ. A small 2-3 mm stone is noted at the right UVJ causing the obstructive change. Bladder is decompressed. Stomach/Bowel: Colon demonstrates some fatty infiltration in the wall in the descending and sigmoid colon likely related to chronic inflammatory bowel change. Some mild pericolonic inflammatory changes are seen in the sigmoid colon. The more proximal colon appears within normal limits. Small bowel is unremarkable. The appendix has been surgically removed. Stomach is within normal limits. Vascular/Lymphatic: No significant vascular findings are present. No enlarged abdominal or pelvic lymph nodes. Reproductive: Uterus and bilateral adnexa are unremarkable. Other: No abdominal wall hernia or abnormality. No abdominopelvic ascites. Musculoskeletal: No acute or significant osseous findings. IMPRESSION: 2-3 mm stone at the right UVJ causing right-sided hydronephrosis and hydroureter. Decompressed gallbladder with density within consistent with cholelithiasis. Changes in the sigmoid colon consistent with inflammatory bowel disease. No diverticulitis is noted. No perforation is seen. Electronically Signed   By: Alcide Clever M.D.   On: 05/12/2020 18:55    Procedures Procedures   Medications Ordered in ED Medications  fentaNYL (SUBLIMAZE) injection 50 mcg (50 mcg Intravenous Given 05/12/20 1709)  sodium chloride 0.9 % bolus 500 mL (0 mLs Intravenous Stopped 05/12/20 1814)  ketorolac  (TORADOL) 15 MG/ML injection 15 mg (15 mg Intravenous Given 05/12/20 1923)  cephALEXin (KEFLEX) capsule 500 mg (500 mg Oral Given 05/12/20 1923)    ED Course  I have reviewed the triage vital signs and the nursing notes.  Pertinent labs & imaging results that were available during my care of the patient were reviewed by me and considered in my medical decision making (see chart for details).    MDM Rules/Calculators/A&P                         Additional history obtained from: 1. Nursing notes from this visit. 2. Family, husband at bedside. 3. Electronic medical records. ------ I ordered, reviewed and interpreted labs which include: CBC without leukocytosis to suggest infectious process, baseline anemia of 10.0. CMP shows no emergent electrolyte derangement, AKI, LFT elevations or gap. Urine pregnancy test negative, doubt ectopic pregnancy. Lipase within normal limits, doubt pancreatitis. Urinalysis shows hemoglobin and RBCs to suggest kidney stone disease.  Nitrite negative.  Does show many bacteria but also 6-10 squamous cells present.  Given presence of stone will treat with course of Keflex and send urine for culture.  CT Renal Stone Study:  IMPRESSION:  2-3 mm stone at the right UVJ causing right-sided hydronephrosis and  hydroureter.    Decompressed gallbladder with density within consistent with  cholelithiasis.    Changes in the sigmoid colon consistent with inflammatory bowel  disease. No diverticulitis is noted. No perforation is seen.  ---------------- Patient reassessed resting comfortably in bed no acute distress.  I discussed findings above with patient and her husband and they state understanding.  Will prescribe patient Flomax 0.4 mg daily x5 days.  Short course Norco for severe pain associated kidney stone disease, PMD P reviewed patient without previous narcotic prescription, will give 4 pills without refill.  Patient states understanding narcotic precautions today.   Will give Zofran ODT for nausea.  Additionally patient was given 15 mg Toradol for acute kidney stone pain in the ER which should help for the next day.  Additionally will start patient on Keflex 500 mg twice daily for possible UTI; discussed with Dr. Charm Barges who agrees with plan.  Patient referred to Dr. Laverle Patter on-call urologist for reevaluation  Patient has been also advised of incidental CT findings and to follow-up with PCP closely.  Of note patient has a penicillin allergy listed, she reports that when she was a small child she was told she had an allergy to penicillins but does not remember any specific reaction.  Feel likelihood of cephalosporin allergy is very low will give 1 dose Keflex here in the ER and monitor for some time for allergic reaction. - Patient monitored in the ER, no evidence for allergic reaction following Keflex, feel patient is safe for discharge at this time in the care of her husband.  Patient made aware of signs/symptoms of allergic reaction to return to the ER if they occur.  At this time there does not appear to be any evidence of an acute emergency medical condition and the patient appears stable for discharge with appropriate outpatient follow up. Diagnosis was discussed with patient who verbalizes understanding of care plan and is agreeable to discharge. I have discussed return precautions with patient and husband who verbalizes understanding. Patient encouraged to follow-up with their PCP and Urology. All questions answered.  Patient's case discussed with Dr. Charm Barges who agrees with plan to discharge with follow-up.   Note: Portions of this report may have been transcribed using voice recognition software. Every effort was made to ensure accuracy; however, inadvertent computerized transcription errors may still be present. Final Clinical Impression(s) / ED Diagnoses Final diagnoses:  Kidney stone  Calculus of gallbladder without cholecystitis without obstruction     Rx / DC Orders ED Discharge Orders         Ordered    HYDROcodone-acetaminophen (NORCO/VICODIN) 5-325 MG tablet  Every 6 hours PRN        05/12/20 1926    tamsulosin (FLOMAX) 0.4 MG CAPS capsule  Daily        05/12/20 1926    ondansetron (ZOFRAN ODT) 4 MG disintegrating tablet  Every 8 hours PRN        05/12/20 1926    cephALEXin (KEFLEX) 500 MG capsule  2 times daily        05/12/20 1926           Jesscia Palau 05/12/20 1955    Terrilee Files, MD 05/13/20 1044

## 2020-05-12 NOTE — ED Notes (Signed)
Bladder scan showing 95ml's urine currently.  Reports she will try to urinate after some of the IV fluid goes in.

## 2020-05-14 LAB — URINE CULTURE: Culture: 20000 — AB

## 2020-12-02 ENCOUNTER — Encounter (HOSPITAL_BASED_OUTPATIENT_CLINIC_OR_DEPARTMENT_OTHER): Payer: Self-pay | Admitting: Emergency Medicine

## 2020-12-02 ENCOUNTER — Other Ambulatory Visit: Payer: Self-pay

## 2020-12-02 ENCOUNTER — Emergency Department (HOSPITAL_BASED_OUTPATIENT_CLINIC_OR_DEPARTMENT_OTHER): Payer: Managed Care, Other (non HMO)

## 2020-12-02 DIAGNOSIS — S9032XA Contusion of left foot, initial encounter: Secondary | ICD-10-CM | POA: Insufficient documentation

## 2020-12-02 DIAGNOSIS — S99922A Unspecified injury of left foot, initial encounter: Secondary | ICD-10-CM | POA: Diagnosis present

## 2020-12-02 DIAGNOSIS — W2103XA Struck by baseball, initial encounter: Secondary | ICD-10-CM | POA: Diagnosis not present

## 2020-12-02 NOTE — ED Triage Notes (Signed)
Patient arrived via POV c/o left foot pain x 2 hrs. Patient states she was struck by line drive in the foot at a baseball game. Patient states 8/10. Patient able to walk in. Patient is AO x 4, VS WDL, limping gait.

## 2020-12-03 ENCOUNTER — Emergency Department (HOSPITAL_BASED_OUTPATIENT_CLINIC_OR_DEPARTMENT_OTHER)
Admission: EM | Admit: 2020-12-03 | Discharge: 2020-12-03 | Disposition: A | Discharge status: Home / Self Care | Payer: Managed Care, Other (non HMO) | Attending: Emergency Medicine | Admitting: Emergency Medicine

## 2020-12-03 DIAGNOSIS — W2103XA Struck by baseball, initial encounter: Secondary | ICD-10-CM

## 2020-12-03 DIAGNOSIS — S9032XA Contusion of left foot, initial encounter: Secondary | ICD-10-CM

## 2020-12-03 MED ORDER — NAPROXEN 250 MG PO TABS
500.0000 mg | ORAL_TABLET | Freq: Once | ORAL | Status: AC
  Administered 2020-12-03: 500 mg via ORAL
  Filled 2020-12-03: qty 2

## 2020-12-03 NOTE — ED Provider Notes (Signed)
[facility] Provider Note: Lowella Dell, MD, FACEP  CSN: [id number] MRN: [id number] ARRIVAL: 12/02/20 at 2216 ROOM: [facility]   CHIEF COMPLAINT  Foot Injury   HISTORY OF PRESENT ILLNESS  12/03/20 1:48 AM Laura Hebert is a 45 y.o. female who was attending her son's baseball game about 7:45 PM yesterday evening.  He hit a foul ball that struck her in the left foot.  She has pain and ecchymosis to the dorsal left foot.  She rates associated pain as an 8 out of 10, throbbing in nature.  It is worse with weightbearing but she is able to bear weight.  She has not taken anything for her pain.  She is having tingling of the dorsal aspect of the toes distal to the injury.   Past Medical History:  Diagnosis Date   Bipolar disorder (HCC)    Crohn's disease (HCC)    Kidney stone     Past Surgical History:  Procedure Laterality Date   APPENDECTOMY     CESAREAN SECTION      No family history on file.  Social History   Tobacco Use   Smoking status: Never   Smokeless tobacco: Never  Vaping Use   Vaping Use: Never used  Substance Use Topics   Alcohol use: Yes    Comment: socially   Drug use: No    Prior to Admission medications   Medication Sig Start Date End Date Taking? Authorizing Provider  carbamazepine (EQUETRO) 200 MG CP12 12 hr capsule Take 200 mg by mouth.    [provider]  guaiFENesin-codeine (ROBITUSSIN AC) 100-10 MG/5ML syrup Take 5 mLs by mouth 3 (three) times daily as needed for cough. 05/10/12   Rolan Bucco, MD  HYDROcodone-acetaminophen (NORCO/VICODIN) 5-325 MG tablet Take 1 tablet by mouth every 6 (six) hours as needed for severe pain. 05/12/20   Harlene Salts A, PA-C  ibuprofen (ADVIL,MOTRIN) 800 MG tablet Take 1 tablet (800 mg total) by mouth 3 (three) times daily. 06/14/17   McDonald, Mia A, PA-C  lamoTRIgine (LAMICTAL) 100 MG tablet Take 100 mg by mouth daily.    [provider]  ondansetron (ZOFRAN ODT) 4 MG  disintegrating tablet Take 1 tablet (4 mg total) by mouth every 8 (eight) hours as needed for nausea or vomiting. 05/12/20   Harlene Salts A, PA-C  tamsulosin (FLOMAX) 0.4 MG CAPS capsule Take 1 capsule (0.4 mg total) by mouth daily. 05/12/20   Harlene Salts A, PA-C    Allergies Penicillins   REVIEW OF SYSTEMS  Negative except as noted here or in the History of Present Illness.   PHYSICAL EXAMINATION  Initial Vital Signs Blood pressure (!) 154/94, pulse 88, temperature 98.2 F (36.8 C), temperature source Oral, resp. rate 18, height 5\' 5"  (1.651 m), weight 70.3 kg, last menstrual period 11/29/2020, SpO2 97 %.  Examination General: Well-developed, well-nourished female in no acute distress; appearance consistent with age of record HENT: normocephalic; atraumatic Eyes: Normal appearance Neck: supple Heart: regular rate and rhythm Lungs: clear to auscultation bilaterally Abdomen: soft; nondistended; nontender; bowel sounds present Extremities: No deformity; ecchymosis and tenderness of dorsal left foot with ulcer and sensation of the dorsal skin, distal capillary reflux brisk Neurologic: Awake, alert and oriented; motor function intact in all extremities and symmetric; no facial droop Skin: Warm and dry Psychiatric: Normal mood and affect   RESULTS  Summary of this visit's results, reviewed and interpreted by myself:   EKG Interpretation  Date/Time:    Ventricular Rate:  PR Interval:    QRS Duration:   QT Interval:    QTC Calculation:   R Axis:     Text Interpretation:         Laboratory Studies: No results found for this or any previous visit (from the past 24 hour(s)). Imaging Studies: DG Foot Complete Left  Result Date: 12/02/2020 CLINICAL DATA:  Status post trauma. EXAM: LEFT FOOT - COMPLETE 3+ VIEW COMPARISON:  None. FINDINGS: There is no evidence of fracture or dislocation. There is no evidence of arthropathy or other focal bone abnormality. Soft tissues  are unremarkable. IMPRESSION: Negative. Electronically Signed   By: Aram Candela M.D.   On: 12/02/2020 22:53    ED COURSE and MDM  Nursing notes, initial and subsequent vitals signs, including pulse oximetry, reviewed and interpreted by myself.  Vitals:   12/02/20 2227 12/02/20 2228  BP:  (!) 154/94  Pulse:  88  Resp:  18  Temp:  98.2 F (36.8 C)  TempSrc:  Oral  SpO2:  97%  Weight: 70.3 kg   Height: 5\' 5"  (1.651 m)    Medications  naproxen (NAPROSYN) tablet 500 mg (has no administration in time range)    No evidence of fracture on radiograph.  She may have had a contusion of the nerves given her paresthesias and decreased sensation dorsally.  We will provide a postop shoe to assist with comfort when ambulating.  PROCEDURES  Procedures   ED DIAGNOSES     ICD-10-CM   1. Contusion of left foot, initial encounter  S90.32XA     2. Struck by baseball, initial encounter           J50.09FG, MD 12/03/20 0157

## 2021-08-06 ENCOUNTER — Inpatient Hospital Stay (HOSPITAL_BASED_OUTPATIENT_CLINIC_OR_DEPARTMENT_OTHER)
Admission: EM | Admit: 2021-08-06 | Discharge: 2021-08-10 | DRG: 387 | Disposition: A | Discharge status: Home / Self Care | Payer: Managed Care, Other (non HMO) | Attending: Internal Medicine | Admitting: Internal Medicine

## 2021-08-06 ENCOUNTER — Other Ambulatory Visit: Payer: Self-pay

## 2021-08-06 ENCOUNTER — Emergency Department (HOSPITAL_BASED_OUTPATIENT_CLINIC_OR_DEPARTMENT_OTHER): Payer: Managed Care, Other (non HMO)

## 2021-08-06 ENCOUNTER — Encounter (HOSPITAL_BASED_OUTPATIENT_CLINIC_OR_DEPARTMENT_OTHER): Payer: Self-pay | Admitting: *Deleted

## 2021-08-06 DIAGNOSIS — K50114 Crohn's disease of large intestine with abscess: Secondary | ICD-10-CM | POA: Diagnosis present

## 2021-08-06 DIAGNOSIS — Z9049 Acquired absence of other specified parts of digestive tract: Secondary | ICD-10-CM | POA: Diagnosis not present

## 2021-08-06 DIAGNOSIS — Z87442 Personal history of urinary calculi: Secondary | ICD-10-CM

## 2021-08-06 DIAGNOSIS — F32A Depression, unspecified: Secondary | ICD-10-CM | POA: Diagnosis present

## 2021-08-06 DIAGNOSIS — K8020 Calculus of gallbladder without cholecystitis without obstruction: Secondary | ICD-10-CM | POA: Diagnosis present

## 2021-08-06 DIAGNOSIS — R1032 Left lower quadrant pain: Secondary | ICD-10-CM | POA: Diagnosis present

## 2021-08-06 DIAGNOSIS — K630 Abscess of intestine: Principal | ICD-10-CM

## 2021-08-06 DIAGNOSIS — Z880 Allergy status to penicillin: Secondary | ICD-10-CM

## 2021-08-06 DIAGNOSIS — Z79899 Other long term (current) drug therapy: Secondary | ICD-10-CM

## 2021-08-06 DIAGNOSIS — K5090 Crohn's disease, unspecified, without complications: Secondary | ICD-10-CM | POA: Diagnosis present

## 2021-08-06 DIAGNOSIS — Z9109 Other allergy status, other than to drugs and biological substances: Secondary | ICD-10-CM | POA: Diagnosis not present

## 2021-08-06 DIAGNOSIS — Z91040 Latex allergy status: Secondary | ICD-10-CM | POA: Diagnosis not present

## 2021-08-06 DIAGNOSIS — F319 Bipolar disorder, unspecified: Secondary | ICD-10-CM | POA: Diagnosis present

## 2021-08-06 DIAGNOSIS — L409 Psoriasis, unspecified: Secondary | ICD-10-CM | POA: Diagnosis present

## 2021-08-06 DIAGNOSIS — D509 Iron deficiency anemia, unspecified: Secondary | ICD-10-CM | POA: Diagnosis present

## 2021-08-06 DIAGNOSIS — F411 Generalized anxiety disorder: Secondary | ICD-10-CM | POA: Diagnosis present

## 2021-08-06 DIAGNOSIS — K651 Peritoneal abscess: Secondary | ICD-10-CM | POA: Diagnosis not present

## 2021-08-06 DIAGNOSIS — K529 Noninfective gastroenteritis and colitis, unspecified: Secondary | ICD-10-CM

## 2021-08-06 LAB — CBC WITH DIFFERENTIAL/PLATELET
Abs Immature Granulocytes: 0.03 10*3/uL (ref 0.00–0.07)
Basophils Absolute: 0 10*3/uL (ref 0.0–0.1)
Basophils Relative: 0 %
Eosinophils Absolute: 0 10*3/uL (ref 0.0–0.5)
Eosinophils Relative: 0 %
HCT: 33.9 % — ABNORMAL LOW (ref 36.0–46.0)
Hemoglobin: 10.5 g/dL — ABNORMAL LOW (ref 12.0–15.0)
Immature Granulocytes: 0 %
Lymphocytes Relative: 4 %
Lymphs Abs: 0.4 10*3/uL — ABNORMAL LOW (ref 0.7–4.0)
MCH: 24 pg — ABNORMAL LOW (ref 26.0–34.0)
MCHC: 31 g/dL (ref 30.0–36.0)
MCV: 77.6 fL — ABNORMAL LOW (ref 80.0–100.0)
Monocytes Absolute: 0.4 10*3/uL (ref 0.1–1.0)
Monocytes Relative: 5 %
Neutro Abs: 7.6 10*3/uL (ref 1.7–7.7)
Neutrophils Relative %: 91 %
Platelets: 464 10*3/uL — ABNORMAL HIGH (ref 150–400)
RBC: 4.37 MIL/uL (ref 3.87–5.11)
RDW: 14.8 % (ref 11.5–15.5)
WBC: 8.4 10*3/uL (ref 4.0–10.5)
nRBC: 0 % (ref 0.0–0.2)

## 2021-08-06 LAB — COMPREHENSIVE METABOLIC PANEL
ALT: 8 U/L (ref 0–44)
AST: 10 U/L — ABNORMAL LOW (ref 15–41)
Albumin: 3.6 g/dL (ref 3.5–5.0)
Alkaline Phosphatase: 104 U/L (ref 38–126)
Anion gap: 8 (ref 5–15)
BUN: 14 mg/dL (ref 6–20)
CO2: 25 mmol/L (ref 22–32)
Calcium: 9.3 mg/dL (ref 8.9–10.3)
Chloride: 104 mmol/L (ref 98–111)
Creatinine, Ser: 0.81 mg/dL (ref 0.44–1.00)
GFR, Estimated: >60 mL/min (ref >60)
Glucose, Bld: 95 mg/dL (ref 70–99)
Potassium: 4.1 mmol/L (ref 3.5–5.1)
Sodium: 137 mmol/L (ref 135–145)
Total Bilirubin: 0.3 mg/dL (ref 0.3–1.2)
Total Protein: 8.4 g/dL — ABNORMAL HIGH (ref 6.5–8.1)

## 2021-08-06 LAB — URINALYSIS, ROUTINE W REFLEX MICROSCOPIC
Glucose, UA: NEGATIVE mg/dL
Hgb urine dipstick: NEGATIVE
Ketones, ur: NEGATIVE mg/dL
Leukocytes,Ua: NEGATIVE
Nitrite: NEGATIVE
Protein, ur: 30 mg/dL — AB
Specific Gravity, Urine: 1.02 (ref 1.005–1.030)
pH: 7 (ref 5.0–8.0)

## 2021-08-06 LAB — LIPASE, BLOOD: Lipase: 27 U/L (ref 11–51)

## 2021-08-06 LAB — URINALYSIS, MICROSCOPIC (REFLEX): RBC / HPF: NONE SEEN RBC/hpf (ref 0–5)

## 2021-08-06 LAB — PREGNANCY, URINE: Preg Test, Ur: NEGATIVE

## 2021-08-06 MED ORDER — SODIUM CHLORIDE 0.9 % IV SOLN: INTRAVENOUS | Status: DC

## 2021-08-06 MED ORDER — SODIUM CHLORIDE 0.9 % IV SOLN: INTRAVENOUS | Status: DC | PRN

## 2021-08-06 MED ORDER — PIPERACILLIN-TAZOBACTAM 3.375 G IVPB
3.3750 g | Freq: Three times a day (TID) | INTRAVENOUS | Status: DC
  Administered 2021-08-07 – 2021-08-10 (×11): 3.375 g via INTRAVENOUS
  Filled 2021-08-06 (×12): qty 50

## 2021-08-06 MED ORDER — ONDANSETRON HCL 4 MG/2ML IJ SOLN
4.0000 mg | Freq: Once | INTRAMUSCULAR | Status: AC
  Administered 2021-08-06: 4 mg via INTRAVENOUS
  Filled 2021-08-06: qty 2

## 2021-08-06 MED ORDER — PIPERACILLIN-TAZOBACTAM 3.375 G IVPB
3.3750 g | Freq: Four times a day (QID) | INTRAVENOUS | Status: DC
  Administered 2021-08-06: 3.375 g via INTRAVENOUS
  Filled 2021-08-06 (×3): qty 50

## 2021-08-06 MED ORDER — ENOXAPARIN SODIUM 40 MG/0.4ML IJ SOSY
40.0000 mg | PREFILLED_SYRINGE | INTRAMUSCULAR | Status: DC
  Filled 2021-08-06: qty 0.4

## 2021-08-06 MED ORDER — IOHEXOL 300 MG/ML  SOLN
100.0000 mL | Freq: Once | INTRAMUSCULAR | Status: AC | PRN
  Administered 2021-08-06: 100 mL via INTRAVENOUS

## 2021-08-06 MED ORDER — OXYCODONE HCL 5 MG PO TABS
5.0000 mg | ORAL_TABLET | ORAL | Status: DC | PRN
  Administered 2021-08-06 – 2021-08-10 (×8): 5 mg via ORAL
  Filled 2021-08-06 (×8): qty 1

## 2021-08-06 MED ORDER — CARBAMAZEPINE ER 200 MG PO TB12
600.0000 mg | ORAL_TABLET | Freq: Every evening | ORAL | Status: DC
  Administered 2021-08-06: 400 mg via ORAL
  Filled 2021-08-06: qty 3

## 2021-08-06 MED ORDER — HYDROMORPHONE HCL 1 MG/ML IJ SOLN
0.5000 mg | INTRAMUSCULAR | Status: DC | PRN
  Administered 2021-08-07 – 2021-08-09 (×4): 1 mg via INTRAVENOUS
  Filled 2021-08-06 (×4): qty 1

## 2021-08-06 MED ORDER — ONDANSETRON HCL 4 MG PO TABS: 4.0000 mg | ORAL_TABLET | Freq: Four times a day (QID) | ORAL | Status: DC | PRN

## 2021-08-06 MED ORDER — SODIUM CHLORIDE 0.9 % IV BOLUS
1000.0000 mL | Freq: Once | INTRAVENOUS | Status: AC
  Administered 2021-08-06: 1000 mL via INTRAVENOUS

## 2021-08-06 MED ORDER — HYDROMORPHONE HCL 1 MG/ML IJ SOLN
1.0000 mg | Freq: Once | INTRAMUSCULAR | Status: AC
  Administered 2021-08-06: 1 mg via INTRAVENOUS
  Filled 2021-08-06: qty 1

## 2021-08-06 MED ORDER — LAMOTRIGINE 100 MG PO TABS
200.0000 mg | ORAL_TABLET | Freq: Every day | ORAL | Status: DC
  Administered 2021-08-06: 200 mg via ORAL
  Filled 2021-08-06 (×2): qty 2

## 2021-08-06 MED ORDER — ONDANSETRON HCL 4 MG/2ML IJ SOLN: 4.0000 mg | Freq: Four times a day (QID) | INTRAMUSCULAR | Status: DC | PRN

## 2021-08-06 MED ORDER — MORPHINE SULFATE (PF) 4 MG/ML IV SOLN
4.0000 mg | Freq: Once | INTRAVENOUS | Status: AC
  Administered 2021-08-06: 4 mg via INTRAVENOUS
  Filled 2021-08-06: qty 1

## 2021-08-06 NOTE — ED Notes (Signed)
ED TO INPATIENT HANDOFF REPORT  ED Nurse Name and Phone #: Cleatrice Burke, RN [phone]   S Name/Age/Gender Laura Hebert 46 y.o. female Room/Bed: MH12/MH12  Code Status   Code Status: Not on file  Home/SNF/Other Home Patient oriented to: self, place, time, and situation Is this baseline? Yes   Triage Complete: Triage complete  Chief Complaint Intra-abdominal abscess (HCC) [K65.1]  Triage Note Presents with abd pain for the past week, for the past 2 days pain is worse, unable to eat, having a lot of nausea.    Allergies Allergies  Allergen Reactions   Latex Rash   Penicillins     Level of Care/Admitting Diagnosis ED Disposition     ED Disposition  Admit   Condition  --   Comment  Hospital Area: [facility] [[id number]]  Level of Care: Med-Surg [16]  May admit patient to [facility] or [facility] if equivalent level of care is available:: No  Interfacility transfer: Yes  Covid Evaluation: Asymptomatic - no recent exposure (last 10 days) testing not required  Diagnosis: Intra-abdominal abscess [facility]) [[id number]]  Admitting Physician: Emeline General [[id number]]  Attending Physician: Emeline General [[id number]]  Estimated length of stay: past midnight tomorrow  Certification:: I certify this patient will need inpatient services for at least 2 midnights          B Medical/Surgery History Past Medical History:  Diagnosis Date   Bipolar disorder (HCC)    Crohn's disease (HCC)    Kidney stone    Past Surgical History:  Procedure Laterality Date   APPENDECTOMY     CESAREAN SECTION       A IV Location/Drains/Wounds Patient Lines/Drains/Airways Status     Active Line/Drains/Airways     Name Placement date Placement time Site Days   Peripheral IV 08/06/21 Right Antecubital 08/06/21  1605  Antecubital  less than 1            Intake/Output Last 24 hours  Intake/Output Summary (Last 24 hours) at 08/06/2021 1830 Last data filed at  08/06/2021 1746 Gross per 24 hour  Intake 980.4 ml  Output --  Net 980.4 ml    Labs/Imaging Results for orders placed or performed during the hospital encounter of 08/06/21 (from the past 48 hour(s))  Urinalysis, Routine w reflex microscopic Urine, Clean Catch     Status: Abnormal   Collection Time: 08/06/21  2:14 PM  Result Value Ref Range   Color, Urine YELLOW YELLOW   APPearance HAZY (A) CLEAR   Specific Gravity, Urine 1.020 1.005 - 1.030   pH 7.0 5.0 - 8.0   Glucose, UA NEGATIVE NEGATIVE mg/dL   Hgb urine dipstick NEGATIVE NEGATIVE   Bilirubin Urine SMALL (A) NEGATIVE   Ketones, ur NEGATIVE NEGATIVE mg/dL   Protein, ur 30 (A) NEGATIVE mg/dL   Nitrite NEGATIVE NEGATIVE   Leukocytes,Ua NEGATIVE NEGATIVE    Comment: Performed at [facility], [address] [place], [place] [address]  CBC with Differential     Status: Abnormal   Collection Time: 08/06/21  2:14 PM  Result Value Ref Range   WBC 8.4 4.0 - 10.5 K/uL   RBC 4.37 3.87 - 5.11 MIL/uL   Hemoglobin 10.5 (L) 12.0 - 15.0 g/dL   HCT 92.4 (L) 26.8 - 34.1 %   MCV 77.6 (L) 80.0 - 100.0 fL   MCH 24.0 (L) 26.0 - 34.0 pg   MCHC 31.0 30.0 - 36.0 g/dL   RDW 96.2 22.9 -  15.5 %   Platelets 464 (H) 150 - 400 K/uL   nRBC 0.0 0.0 - 0.2 %   Neutrophils Relative % 91 %   Neutro Abs 7.6 1.7 - 7.7 K/uL   Lymphocytes Relative 4 %   Lymphs Abs 0.4 (L) 0.7 - 4.0 K/uL   Monocytes Relative 5 %   Monocytes Absolute 0.4 0.1 - 1.0 K/uL   Eosinophils Relative 0 %   Eosinophils Absolute 0.0 0.0 - 0.5 K/uL   Basophils Relative 0 %   Basophils Absolute 0.0 0.0 - 0.1 K/uL   Immature Granulocytes 0 %   Abs Immature Granulocytes 0.03 0.00 - 0.07 K/uL    Comment: Performed at [facility], [address] [place], [place] [address]  Comprehensive metabolic panel     Status: Abnormal   Collection Time: 08/06/21  2:14 PM  Result Value Ref Range   Sodium 137 135 - 145 mmol/L   Potassium 4.1 3.5 - 5.1 mmol/L   Chloride 104  98 - 111 mmol/L   CO2 25 22 - 32 mmol/L   Glucose, Bld 95 70 - 99 mg/dL    Comment: Glucose reference range applies only to samples taken after fasting for at least 8 hours.   BUN 14 6 - 20 mg/dL   Creatinine, Ser 7.82 0.44 - 1.00 mg/dL   Calcium 9.3 8.9 - 95.6 mg/dL   Total Protein 8.4 (H) 6.5 - 8.1 g/dL   Albumin 3.6 3.5 - 5.0 g/dL   AST 10 (L) 15 - 41 U/L   ALT 8 0 - 44 U/L   Alkaline Phosphatase 104 38 - 126 U/L   Total Bilirubin 0.3 0.3 - 1.2 mg/dL   GFR, Estimated >21 >30 mL/min    Comment: (NOTE) Calculated using the CKD-EPI Creatinine Equation (2021)    Anion gap 8 5 - 15    Comment: Performed at [facility], [address] [place], [place] [address]  Lipase, blood     Status: None   Collection Time: 08/06/21  2:14 PM  Result Value Ref Range   Lipase 27 11 - 51 U/L    Comment: Performed at [facility], [address] [place], [place] [address]  Urinalysis, Microscopic (reflex)     Status: Abnormal   Collection Time: 08/06/21  2:14 PM  Result Value Ref Range   RBC / HPF NONE SEEN 0 - 5 RBC/hpf   WBC, UA 0-5 0 - 5 WBC/hpf   Bacteria, UA MANY (A) NONE SEEN   Squamous Epithelial / LPF 6-10 0 - 5   Mucus PRESENT     Comment: Performed at [facility], [address] [place], [place] [address]  Pregnancy, urine     Status: None   Collection Time: 08/06/21  2:14 PM  Result Value Ref Range   Preg Test, Ur NEGATIVE NEGATIVE    Comment:        THE SENSITIVITY OF THIS METHODOLOGY IS >20 mIU/mL. Performed at [facility], [address], [place], [place] [address]    CT Abdomen Pelvis W Contrast  Result Date: 08/06/2021 CLINICAL DATA:  Abdominal pain and nausea and vomiting for 1 week. Crohn disease. EXAM: CT ABDOMEN AND PELVIS WITH CONTRAST TECHNIQUE: Multidetector CT imaging of the abdomen and pelvis was performed using the standard protocol following bolus administration of intravenous contrast. RADIATION DOSE REDUCTION: This  exam was performed according to the departmental dose-optimization program  which includes automated exposure control, adjustment of the mA and/or kV according to patient size and/or use of iterative reconstruction technique. CONTRAST:  OMNIPAQUE IOHEXOL 300 MG/ML  SOLN COMPARISON:  Noncontrast CT on 05/12/2020 FINDINGS: Lower Chest: No acute findings. Hepatobiliary: No hepatic masses identified. Cholelithiasis is noted, however there is no evidence of cholecystitis or biliary ductal dilatation. Pancreas:  No mass or inflammatory changes. Spleen: Within normal limits in size and appearance. Adrenals/Urinary Tract: No masses identified. No evidence of ureteral calculi or hydronephrosis. Stomach/Bowel: Changes of chronic colitis are again seen involving the left colon. Superimposed acute colitis is seen in the mid sigmoid colon, with a pericolonic abscess measuring 3.0 x 2.8 cm. No evidence of free intraperitoneal air. Reactive thickening adjacent small bowel loop is seen in the left lower quadrant, however there is no evidence of bowel obstruction. No evidence of diverticular disease seen involving the colon. Terminal ileum is unremarkable in appearance. Vascular/Lymphatic: No pathologically enlarged lymph nodes. No acute vascular findings. Reproductive:  No mass or other significant abnormality. Other:  None. Musculoskeletal:  No suspicious bone lesions identified. IMPRESSION: Acute on chronic colitis involving the mid sigmoid colon, with 3 cm pericolonic abscess. No evidence of diverticular disease or free intraperitoneal air. Reactive wall thickening of adjacent small bowel in left lower quadrant. No evidence of bowel obstruction. Cholelithiasis. No radiographic evidence of cholecystitis. Electronically Signed   By: Danae Orleans M.D.   On: 08/06/2021 16:46    Pending Labs Unresulted Labs (From admission, onward)    None       Vitals/Pain Today's Vitals   08/06/21 1700 08/06/21 1730 08/06/21 1745  08/06/21 1800  BP: 138/81 138/71 132/81 138/85  Pulse: 96 99 94 95  Resp: 20     Temp:      TempSrc:      SpO2: 96% 96% 97% 96%  Weight:      Height:      PainSc:        Isolation Precautions No active isolations  Medications Medications  piperacillin-tazobactam (ZOSYN) IVPB 3.375 g (3.375 g Intravenous New Bag/Given 08/06/21 1749)  0.9 %  sodium chloride infusion ( Intravenous New Bag/Given 08/06/21 1749)  sodium chloride 0.9 % bolus 1,000 mL (0 mLs Intravenous Stopped 08/06/21 1746)  ondansetron (ZOFRAN) injection 4 mg (4 mg Intravenous Given 08/06/21 1611)  morphine (PF) 4 MG/ML injection 4 mg (4 mg Intravenous Given 08/06/21 1612)  iohexol (OMNIPAQUE) 300 MG/ML solution 100 mL (100 mLs Intravenous Contrast Given 08/06/21 1630)    Mobility walks Low fall risk   Focused Assessments Abdomen soft, slightly tender   R Recommendations: See Admitting Provider Note  Report given to:  [facility]  Additional Notes:

## 2021-08-06 NOTE — H&P (Addendum)
History and Physical    Laura Hebert BPZ:[id number] DOB: August 21, 1975 DOA: 08/06/2021  Referring MD/NP/PA: EDP PCP:  Patient coming from: [facility]  Chief Complaint: Abdominal pain, nausea  HPI: Laura Hebert is a 45/F with history of Crohn's disease, depression, bipolar disorder, kidney stones, presented to the ED with abdominal pain, nausea, fevers and chills, symptoms started approximately a week ago. -Remote history of Crohn's disease, diagnosed in [place] -previously treated with Asacol and Remicade, has been in remission for over 10 years, has not been on any therapy during this time. -She has been feeling hot and cold for the past week, sleeping a lot the last few days, had abdominal discomfort and pain intermittently during this time, pain got much more intense over the last 24 hours, presented to med [facility]  ED Course: Mildly tachycardic, afebrile, WBC and electrolytes normal, platelets mildly elevated, mildly anemic, CT abdomen pelvis noted acute on chronic colitis involving mid sigmoid colon with a 3 cm pericolonic abscess. -She was given IV fluids and a dose of Zosyn, [facility] as well as general surgery were consulted  Review of Systems: As per HPI otherwise 14 point review of systems negative.   Past Medical History:  Diagnosis Date   Bipolar disorder (HCC)    Crohn's disease (HCC)    Kidney stone     Past Surgical History:  Procedure Laterality Date   APPENDECTOMY     CESAREAN SECTION       reports that she has never smoked. She has never used smokeless tobacco. She reports current alcohol use. She reports that she does not use drugs.  Allergies  Allergen Reactions   Latex Rash   Penicillins     History reviewed. No pertinent family history.   Prior to Admission medications   Medication Sig Start Date End Date Taking? Authorizing Provider  carbamazepine (EQUETRO) 200 MG CP12 12 hr capsule Take 200 mg by  mouth.    [provider]  lamoTRIgine (LAMICTAL) 100 MG tablet Take 100 mg by mouth daily.    [provider]    Physical Exam: Vitals:   08/06/21 1620 08/06/21 1640 08/06/21 1700 08/06/21 1730  BP: 123/71 (!) 129/56 138/81 138/71  Pulse: 95 100 96 99  Resp:   20   Temp:      TempSrc:      SpO2: 96% 100% 96% 96%  Weight:      Height:       Gen: Awake, Alert, Oriented X 3, no distress HEENT: no JVD Lungs: Good air movement bilaterally, CTAB CVS: S1S2/RRR Abd: Soft, mild periumbilical tenderness, nondistended, bowel sounds present Extremities: No edema Skin: no new rashes on exposed skin    Labs on Admission: I have personally reviewed following labs and imaging studies  CBC: Recent Labs  Lab 08/06/21 1414  WBC 8.4  NEUTROABS 7.6  HGB 10.5*  HCT 33.9*  MCV 77.6*  PLT 464*   Basic Metabolic Panel: Recent Labs  Lab 08/06/21 1414  NA 137  K 4.1  CL 104  CO2 25  GLUCOSE 95  BUN 14  CREATININE 0.81  CALCIUM 9.3   GFR: Estimated Creatinine Clearance: 80.3 mL/min (by C-G formula based on SCr of 0.81 mg/dL). Liver Function Tests: Recent Labs  Lab 08/06/21 1414  AST 10*  ALT 8  ALKPHOS 104  BILITOT 0.3  PROT 8.4*  ALBUMIN 3.6   Recent Labs  Lab 08/06/21 1414  LIPASE 27   No results  for input(s): AMMONIA in the last 168 hours. Coagulation Profile: No results for input(s): INR, PROTIME in the last 168 hours. Cardiac Enzymes: No results for input(s): CKTOTAL, CKMB, CKMBINDEX, TROPONINI in the last 168 hours. BNP (last 3 results) No results for input(s): PROBNP in the last 8760 hours. HbA1C: No results for input(s): HGBA1C in the last 72 hours. CBG: No results for input(s): GLUCAP in the last 168 hours. Lipid Profile: No results for input(s): CHOL, HDL, LDLCALC, TRIG, CHOLHDL, LDLDIRECT in the last 72 hours. Thyroid Function Tests: No results for input(s): TSH, T4TOTAL, FREET4, T3FREE, THYROIDAB in the last 72 hours. Anemia  Panel: No results for input(s): VITAMINB12, FOLATE, FERRITIN, TIBC, IRON, RETICCTPCT in the last 72 hours. Urine analysis:    Component Value Date/Time   COLORURINE YELLOW 08/06/2021 1414   APPEARANCEUR HAZY (A) 08/06/2021 1414   LABSPEC 1.020 08/06/2021 1414   PHURINE 7.0 08/06/2021 1414   GLUCOSEU NEGATIVE 08/06/2021 1414   HGBUR NEGATIVE 08/06/2021 1414   BILIRUBINUR SMALL (A) 08/06/2021 1414   KETONESUR NEGATIVE 08/06/2021 1414   PROTEINUR 30 (A) 08/06/2021 1414   NITRITE NEGATIVE 08/06/2021 1414   LEUKOCYTESUR NEGATIVE 08/06/2021 1414   Sepsis Labs: @LABRCNTIP (procalcitonin:4,lacticidven:4) )No results found for this or any previous visit (from the past 240 hour(s)).   Radiological Exams on Admission: CT Abdomen Pelvis W Contrast  Result Date: 08/06/2021 CLINICAL DATA:  Abdominal pain and nausea and vomiting for 1 week. Crohn disease. EXAM: CT ABDOMEN AND PELVIS WITH CONTRAST TECHNIQUE: Multidetector CT imaging of the abdomen and pelvis was performed using the standard protocol following bolus administration of intravenous contrast. RADIATION DOSE REDUCTION: This exam was performed according to the departmental dose-optimization program which includes automated exposure control, adjustment of the mA and/or kV according to patient size and/or use of iterative reconstruction technique. CONTRAST:  10/06/2021 OMNIPAQUE IOHEXOL 300 MG/ML  SOLN COMPARISON:  Noncontrast CT on 05/12/2020 FINDINGS: Lower Chest: No acute findings. Hepatobiliary: No hepatic masses identified. Cholelithiasis is noted, however there is no evidence of cholecystitis or biliary ductal dilatation. Pancreas:  No mass or inflammatory changes. Spleen: Within normal limits in size and appearance. Adrenals/Urinary Tract: No masses identified. No evidence of ureteral calculi or hydronephrosis. Stomach/Bowel: Changes of chronic colitis are again seen involving the left colon. Superimposed acute colitis is seen in the mid sigmoid  colon, with a pericolonic abscess measuring 3.0 x 2.8 cm. No evidence of free intraperitoneal air. Reactive thickening adjacent small bowel loop is seen in the left lower quadrant, however there is no evidence of bowel obstruction. No evidence of diverticular disease seen involving the colon. Terminal ileum is unremarkable in appearance. Vascular/Lymphatic: No pathologically enlarged lymph nodes. No acute vascular findings. Reproductive:  No mass or other significant abnormality. Other:  None. Musculoskeletal:  No suspicious bone lesions identified. IMPRESSION: Acute on chronic colitis involving the mid sigmoid colon, with 3 cm pericolonic abscess. No evidence of diverticular disease or free intraperitoneal air. Reactive wall thickening of adjacent small bowel in left lower quadrant. No evidence of bowel obstruction. Cholelithiasis. No radiographic evidence of cholecystitis. Electronically Signed   By: 07/12/2020 M.D.   On: 08/06/2021 16:46     Assessment/Plan   Acute on chronic colitis 3 mm pericolonic abscess -In the background of history of Crohn's disease, reportedly in remission for> 10 years -Continue IV Zosyn, IV fluids -Clear liquids, supportive care, antiemetics and pain control -[facility], called by EDP will eval pt tomorrow -[facility] consulted too, may need IR eval if this  abscess can be drained percutaneously  Cholelithiasis -Not causing symptoms at this time,  Chronic anemia -Stable, monitor   Depression, bipolar disorder -Resume home regimen of carbamazepine and Lamictal  DVT prophylaxis: lovenox Code Status: full code Family Communication: Discussed with patient in detail Disposition Plan: Inpatient Consults called: [facility] and general surgery called by EDP Admission status: Inpatient  Zannie Cove MD [facility]   08/06/2021, 6:14 PM

## 2021-08-06 NOTE — ED Provider Notes (Signed)
[facility] EMERGENCY DEPARTMENT Provider Note   CSN: [id number] Arrival date & time: 08/06/21  1347     History  Chief Complaint  Patient presents with   Abdominal Pain    Laura Hebert is a 46 y.o. female.   Abdominal Pain  Patient with medical history of Crohn's disease, bipolar, status post C-section and appendectomy presents today with abdominal pain, nausea and vomiting.  This has been going on for about a week, the pain is mostly in the right upper quadrant and it feels like a "twisting" pain.  It is worse after she eats or drinks anything so she is eating and drinking less.  Associated with multiple episodes of emesis and nausea.  Does not radiate elsewhere, no fevers at home but she does report feeling hot.  She is not having any rectal bleeding, no change in bowel habits, no diarrhea.  Social history: Alcohol, infrequent socially  Home Medications Prior to Admission medications   Medication Sig Start Date End Date Taking? Authorizing Provider  carbamazepine (EQUETRO) 200 MG CP12 12 hr capsule Take 200 mg by mouth.    [provider]  lamoTRIgine (LAMICTAL) 100 MG tablet Take 100 mg by mouth daily.    [provider]      Allergies    Latex and Penicillins    Review of Systems   Review of Systems  Gastrointestinal:  Positive for abdominal pain.   Physical Exam Updated Vital Signs BP 138/71   Pulse 99   Temp 97.9 F (36.6 C) (Oral)   Resp 20   Ht 5\' 1"  (1.549 m)   Wt 70.3 kg   SpO2 96%   BMI 29.29 kg/m  Physical Exam Vitals and nursing note reviewed. Exam conducted with a chaperone present.  Constitutional:      Appearance: Normal appearance. She is obese.     Comments: BMI 29  HENT:     Head: Normocephalic and atraumatic.  Eyes:     General: No scleral icterus.       Right eye: No discharge.        Left eye: No discharge.     Extraocular Movements: Extraocular movements intact.     Pupils: Pupils are equal, round,  and reactive to light.  Cardiovascular:     Rate and Rhythm: Normal rate and regular rhythm.     Pulses: Normal pulses.     Heart sounds: Normal heart sounds. No murmur heard.   No friction rub. No gallop.  Pulmonary:     Effort: Pulmonary effort is normal. No respiratory distress.     Breath sounds: Normal breath sounds.  Abdominal:     General: Abdomen is flat. A surgical scar is present. Bowel sounds are normal. There is no distension.     Palpations: Abdomen is soft.     Tenderness: There is abdominal tenderness in the right upper quadrant and epigastric area. Positive signs include Murphy's sign.  Skin:    General: Skin is warm and dry.     Coloration: Skin is not jaundiced.  Neurological:     Mental Status: She is alert. Mental status is at baseline.     Coordination: Coordination normal.    ED Results / Procedures / Treatments   Labs (all labs ordered are listed, but only abnormal results are displayed) Labs Reviewed  URINALYSIS, ROUTINE W REFLEX MICROSCOPIC - Abnormal; Notable for the following components:      Result Value   APPearance HAZY (*)  Bilirubin Urine SMALL (*)    Protein, ur 30 (*)    All other components within normal limits  CBC WITH DIFFERENTIAL/PLATELET - Abnormal; Notable for the following components:   Hemoglobin 10.5 (*)    HCT 33.9 (*)    MCV 77.6 (*)    MCH 24.0 (*)    Platelets 464 (*)    Lymphs Abs 0.4 (*)    All other components within normal limits  COMPREHENSIVE METABOLIC PANEL - Abnormal; Notable for the following components:   Total Protein 8.4 (*)    AST 10 (*)    All other components within normal limits  URINALYSIS, MICROSCOPIC (REFLEX) - Abnormal; Notable for the following components:   Bacteria, UA MANY (*)    All other components within normal limits  LIPASE, BLOOD  PREGNANCY, URINE    EKG None  Radiology CT Abdomen Pelvis W Contrast  Result Date: 08/06/2021 CLINICAL DATA:  Abdominal pain and nausea and vomiting for 1  week. Crohn disease. EXAM: CT ABDOMEN AND PELVIS WITH CONTRAST TECHNIQUE: Multidetector CT imaging of the abdomen and pelvis was performed using the standard protocol following bolus administration of intravenous contrast. RADIATION DOSE REDUCTION: This exam was performed according to the departmental dose-optimization program which includes automated exposure control, adjustment of the mA and/or kV according to patient size and/or use of iterative reconstruction technique. CONTRAST:  OMNIPAQUE IOHEXOL 300 MG/ML  SOLN COMPARISON:  Noncontrast CT on 05/12/2020 FINDINGS: Lower Chest: No acute findings. Hepatobiliary: No hepatic masses identified. Cholelithiasis is noted, however there is no evidence of cholecystitis or biliary ductal dilatation. Pancreas:  No mass or inflammatory changes. Spleen: Within normal limits in size and appearance. Adrenals/Urinary Tract: No masses identified. No evidence of ureteral calculi or hydronephrosis. Stomach/Bowel: Changes of chronic colitis are again seen involving the left colon. Superimposed acute colitis is seen in the mid sigmoid colon, with a pericolonic abscess measuring 3.0 x 2.8 cm. No evidence of free intraperitoneal air. Reactive thickening adjacent small bowel loop is seen in the left lower quadrant, however there is no evidence of bowel obstruction. No evidence of diverticular disease seen involving the colon. Terminal ileum is unremarkable in appearance. Vascular/Lymphatic: No pathologically enlarged lymph nodes. No acute vascular findings. Reproductive:  No mass or other significant abnormality. Other:  None. Musculoskeletal:  No suspicious bone lesions identified. IMPRESSION: Acute on chronic colitis involving the mid sigmoid colon, with 3 cm pericolonic abscess. No evidence of diverticular disease or free intraperitoneal air. Reactive wall thickening of adjacent small bowel in left lower quadrant. No evidence of bowel obstruction. Cholelithiasis. No  radiographic evidence of cholecystitis. Electronically Signed   By: Danae Orleans M.D.   On: 08/06/2021 16:46    Procedures .Critical Care Performed by: Theron Arista, PA-C Authorized by: Theron Arista, PA-C   Critical care provider statement:    Critical care time (minutes):  30   Critical care start time:  08/06/2021 4:22 PM   Critical care end time:  08/06/2021 5:52 PM   Critical care was necessary to treat or prevent imminent or life-threatening deterioration of the following conditions: Intra-abdominal infection.   Critical care was time spent personally by me on the following activities:  Development of treatment plan with patient or surrogate, discussions with consultants, evaluation of patient's response to treatment, examination of patient, ordering and review of laboratory studies, ordering and review of radiographic studies, ordering and performing treatments and interventions, pulse oximetry, re-evaluation of patient's condition and review of old charts   Care  discussed with: admitting provider      Medications Ordered in ED Medications  piperacillin-tazobactam (ZOSYN) IVPB 3.375 g (3.375 g Intravenous New Bag/Given 08/06/21 1749)  0.9 %  sodium chloride infusion ( Intravenous New Bag/Given 08/06/21 1749)  sodium chloride 0.9 % bolus 1,000 mL (0 mLs Intravenous Stopped 08/06/21 1746)  ondansetron (ZOFRAN) injection 4 mg (4 mg Intravenous Given 08/06/21 1611)  morphine (PF) 4 MG/ML injection 4 mg (4 mg Intravenous Given 08/06/21 1612)  iohexol (OMNIPAQUE) 300 MG/ML solution 100 mL (100 mLs Intravenous Contrast Given 08/06/21 1630)    ED Course/ Medical Decision Making/ A&P                           Medical Decision Making Amount and/or Complexity of Data Reviewed Labs: ordered. Radiology: ordered.  Risk Prescription drug management. Decision regarding hospitalization.   This patient presents to the ED for concern of right upper quadrant/epigastric pain, and vomiting this involves an  extensive number of treatment options, and is a complaint that carries with it a high risk of complications and morbidity.  The differential diagnosis includes cholecystitis, symptomatic cholelithiasis, gastritis, pancreatitis, gastric ulcer, Crohn's flareup, although I think this is less likely given the lack of rectal bleeding and the location in relationship to food.  Patient's presentation is complicated by their history of Crohn's, patient however is not on any immunomodulators.    I reviewed external records and I do not see any gastroenterology notes.   Additional history obtained:   Independent historian: husband  Reviewed external records including CT renal study in 2022 which did show findings consistent with small gallstones in the gallbladder but no cholecystitis at that time.    Lab Tests:  I ordered, viewed, and personally interpreted labs.  The pertinent results include: No leukocytosis, stable anemia with a hemoglobin of 10.5.  Slight thrombocyte ptosis no transaminitis, no gross electrolyte derangement or AKI.  UA with some bilirubin proteinuria consistent with her reported emesis.  No evidence of underlying UTI.  Lipase is within normal limits, this in commendation with lack of alcohol use makes pancreatitis less likely.  Patient is not pregnant, not a ruptured ectopic pregnancy.    Imaging Studies ordered:  I directly visualized the CT Abd/pelvis, which showed acute on chronic colitis with a 3 cm pericolonic abscess.  There is also cholelithiasis without evidence of cholecystitis.  I agree with the radiologist interpretation    ECG/Cardiac monitoring:   The patient was maintained on a cardiac monitor.  Visualized monitor strip which showed Sinus tachycardia 105 per my interpretation.    Medicines ordered and prescription drug management:  I ordered medication including: Fluid bolus, Zofran, morphine  I have reviewed the patients home medicines and have made  adjustments as needed    Consultations Obtained:  I requested consultation with the gastroenterology, general surgery, hospitalist service.  Discussed lab and imaging findings as well as pertinent plan - they recommend:  I spoke with Dr. Pati Gallo with gastroenterology given patient's history of colitis.  She will consult on the patient tomorrow at [facility] long ED. I spoke with Dr. Magnus Ivan with general surgery, he agrees with the Zosyn and hospitalist admission. Spoke with Dr. Chipper Herb with hospitalist, he will admit the patient.  Reevaluation:  After the interventions noted above, I reevaluated the patient and found patient's pain improved, still tearful.   Problems addressed / ED Course: Patient will need admission for IV antibiotics.  I have started patient  on Zosyn, reviewed penicillin allergy and patient does not remember and said it was an allergy as a child.  We will proceed with Zosyn.   Social Determinants of Health:    Disposition:   After consideration of the diagnostic results and the patients response to treatment, I feel that the patent would benefit from admission.            Final Clinical Impression(s) / ED Diagnoses Final diagnoses:  Colitis  Pericolonic abscess    Rx / DC Orders ED Discharge Orders     None         Theron Arista, PA-C 08/06/21 1753    Cathren Laine, MD 08/06/21 1900

## 2021-08-06 NOTE — ED Triage Notes (Signed)
Presents with abd pain for the past week, for the past 2 days pain is worse, unable to eat, having a lot of nausea.

## 2021-08-06 NOTE — ED Notes (Signed)
ED Provider at bedside. 

## 2021-08-07 DIAGNOSIS — K651 Peritoneal abscess: Secondary | ICD-10-CM | POA: Diagnosis not present

## 2021-08-07 LAB — COMPREHENSIVE METABOLIC PANEL
ALT: 7 U/L (ref 0–44)
AST: 7 U/L — ABNORMAL LOW (ref 15–41)
Albumin: 2.7 g/dL — ABNORMAL LOW (ref 3.5–5.0)
Alkaline Phosphatase: 77 U/L (ref 38–126)
Anion gap: 6 (ref 5–15)
BUN: 10 mg/dL (ref 6–20)
CO2: 26 mmol/L (ref 22–32)
Calcium: 8.5 mg/dL — ABNORMAL LOW (ref 8.9–10.3)
Chloride: 107 mmol/L (ref 98–111)
Creatinine, Ser: 0.9 mg/dL (ref 0.44–1.00)
GFR, Estimated: >60 mL/min (ref >60)
Glucose, Bld: 90 mg/dL (ref 70–99)
Potassium: 3.5 mmol/L (ref 3.5–5.1)
Sodium: 139 mmol/L (ref 135–145)
Total Bilirubin: 0.4 mg/dL (ref 0.3–1.2)
Total Protein: 6.5 g/dL (ref 6.5–8.1)

## 2021-08-07 LAB — CBC
HCT: 27.6 % — ABNORMAL LOW (ref 36.0–46.0)
Hemoglobin: 8.1 g/dL — ABNORMAL LOW (ref 12.0–15.0)
MCH: 23.9 pg — ABNORMAL LOW (ref 26.0–34.0)
MCHC: 29.3 g/dL — ABNORMAL LOW (ref 30.0–36.0)
MCV: 81.4 fL (ref 80.0–100.0)
Platelets: 326 10*3/uL (ref 150–400)
RBC: 3.39 MIL/uL — ABNORMAL LOW (ref 3.87–5.11)
RDW: 14.8 % (ref 11.5–15.5)
WBC: 7 10*3/uL (ref 4.0–10.5)
nRBC: 0 % (ref 0.0–0.2)

## 2021-08-07 LAB — HIV ANTIBODY (ROUTINE TESTING W REFLEX): HIV Screen 4th Generation wRfx: NONREACTIVE

## 2021-08-07 MED ORDER — DIPHENHYDRAMINE HCL 50 MG/ML IJ SOLN
25.0000 mg | Freq: Once | INTRAMUSCULAR | Status: AC
  Administered 2021-08-07: 25 mg via INTRAVENOUS
  Filled 2021-08-07: qty 1

## 2021-08-07 MED ORDER — CARBAMAZEPINE ER 200 MG PO TB12
600.0000 mg | ORAL_TABLET | Freq: Every day | ORAL | Status: DC
  Filled 2021-08-07: qty 3

## 2021-08-07 MED ORDER — CARBAMAZEPINE ER 200 MG PO TB12
200.0000 mg | ORAL_TABLET | Freq: Every evening | ORAL | Status: DC | PRN
Start: 2021-08-07 — End: 2021-08-10

## 2021-08-07 MED ORDER — LAMOTRIGINE 100 MG PO TABS
200.0000 mg | ORAL_TABLET | Freq: Every day | ORAL | Status: DC
  Administered 2021-08-07 – 2021-08-09 (×3): 200 mg via ORAL
  Filled 2021-08-07 (×3): qty 2

## 2021-08-07 MED ORDER — DIPHENHYDRAMINE HCL 25 MG PO CAPS
25.0000 mg | ORAL_CAPSULE | Freq: Once | ORAL | Status: AC
  Administered 2021-08-07: 25 mg via ORAL
  Filled 2021-08-07: qty 1

## 2021-08-07 MED ORDER — CARBAMAZEPINE ER 200 MG PO TB12
400.0000 mg | ORAL_TABLET | Freq: Every day | ORAL | Status: DC
  Administered 2021-08-07 – 2021-08-09 (×3): 400 mg via ORAL
  Filled 2021-08-07 (×4): qty 2

## 2021-08-07 NOTE — Consult Note (Signed)
[facility] Gastroenterology Consult  Referring Provider: ER Primary Care Physician:  Jamal Collin, PA-C Primary Gastroenterologist: Gentry Fitz  Reason for Consultation: Abscess  HPI: Laura Hebert is a 46 y.o. female history of Crohn's disease, diagnosed at age 9 in [place], without treatment at least for the last 9 years. Patient states in the past she was treated with Asacol and was on Remicade infusions, which was stopped when she was pregnant 9 years ago and has not been on any treatment since. Her last colonoscopy was at least 7 years ago in [place].  Patient states she was in her usual state of health until about few days ago when she developed severe pain in the epigastric and upper abdomen and left lower abdomen associated with nausea and nonbilious, watery vomiting which prompted her to go to the ED. In the ER, she had a CAT scan of abdomen pelvis with contrast which showed cholelithiasis, changes of chronic colitis involving left colon, superimposed acute colitis in mid sigmoid with a pericolonic abscess measuring 3 x 2.8 cm, no free intraperitoneal air, reactive thickening adjacent small bowel and left lower quadrant without evidence of obstruction or diverticulosis.   Patient denies change in bowel habits, she may intermittently have diarrhea but denies constipation, denies noticing blood in stool or black stools denies nocturnal symptoms. Occasionally she has self-limited abdominal pain, which resolves on its own and has not required steroids. Patient has not been to a gastroenterologist in several years.  She denies acid reflux, heartburn, difficulty swallowing, pain on swallowing, unintentional weight loss, loss of appetite.  There is no family history of Crohn's disease, ulcerative colitis or colon cancer.  Patient has psoriasis and uses topical medications. She has not smoked cigarettes for over 18 years. She occasionally drinks alcohol, socially. She denies use of  marijuana or recreational drugs IV drug abuse.   Past Medical History:  Diagnosis Date   Bipolar disorder (HCC)    Crohn's disease (HCC)    Kidney stone     Past Surgical History:  Procedure Laterality Date   APPENDECTOMY     CESAREAN SECTION      Prior to Admission medications   Medication Sig Start Date End Date Taking? Authorizing Provider  carbamazepine (TEGRETOL XR) 200 MG 12 hr tablet Take 400 mg by mouth every evening. 07/03/21  Yes [provider]  lamoTRIgine (LAMICTAL) 200 MG tablet Take 200 mg by mouth daily. 07/25/21  Yes [provider]    Current Facility-Administered Medications  Medication Dose Route Frequency Provider Last Rate Last Admin   0.9 %  sodium chloride infusion   Intravenous PRN Zannie Cove, MD 10 mL/hr at 08/06/21 1749 New Bag at 08/06/21 1749   0.9 %  sodium chloride infusion   Intravenous Continuous Zannie Cove, MD 75 mL/hr at 08/07/21 0838 New Bag at 08/07/21 0838   carbamazepine (TEGRETOL XR) 12 hr tablet 600 mg  600 mg Oral QPM Zannie Cove, MD   400 mg at 08/06/21 2220   enoxaparin (LOVENOX) injection 40 mg  40 mg Subcutaneous Q24H Zannie Cove, MD       HYDROmorphone (DILAUDID) injection 0.5-1 mg  0.5-1 mg Intravenous Q4H PRN Zannie Cove, MD       lamoTRIgine (LAMICTAL) tablet 200 mg  200 mg Oral Daily Zannie Cove, MD   200 mg at 08/06/21 2220   ondansetron (ZOFRAN) tablet 4 mg  4 mg Oral Q6H PRN Zannie Cove, MD       Or   ondansetron [facility]) injection 4  mg  4 mg Intravenous Q6H PRN Zannie Cove, MD       oxyCODONE (Oxy IR/ROXICODONE) immediate release tablet 5 mg  5 mg Oral Q4H PRN Zannie Cove, MD   5 mg at 08/07/21 0619   piperacillin-tazobactam (ZOSYN) IVPB 3.375 g  3.375 g Intravenous Blair Heys, MD 12.5 mL/hr at 08/07/21 0927 3.375 g at 08/07/21 0927    Allergies as of 08/06/2021 - Review Complete 08/06/2021  Allergen Reaction Noted   Latex Rash 08/06/2021   Penicillins   05/10/2012   Nylon Rash 09/18/2013    History reviewed. No pertinent family history.  Social History   Socioeconomic History   Marital status: Married    Spouse name: Not on file   Number of children: Not on file   Years of education: Not on file   Highest education level: Not on file  Occupational History   Not on file  Tobacco Use   Smoking status: Never   Smokeless tobacco: Never  Vaping Use   Vaping Use: Never used  Substance and Sexual Activity   Alcohol use: Yes    Comment: socially   Drug use: No   Sexual activity: Not on file  Other Topics Concern   Not on file  Social History Narrative   Not on file   Social Determinants of Health   Financial Resource Strain: Not on file  Food Insecurity: Not on file  Transportation Needs: Not on file  Physical Activity: Not on file  Stress: Not on file  Social Connections: Not on file  Intimate Partner Violence: Not on file    Review of Systems:  GI: Described in detail in HPI.    Gen: Denies any fever, chills, rigors, night sweats, anorexia, fatigue, weakness, malaise, involuntary weight loss, and sleep disorder CV: Denies chest pain, angina, palpitations, syncope, orthopnea, PND, peripheral edema, and claudication. Resp: Denies dyspnea, cough, sputum, wheezing, coughing up blood. GU : Denies urinary burning, blood in urine, urinary frequency, urinary hesitancy, nocturnal urination, and urinary incontinence. MS: Denies joint pain or swelling.  Denies muscle weakness, cramps, atrophy.  Derm: Denies rash, itching, oral ulcerations, hives, unhealing ulcers.  Psych: Denies depression, anxiety, memory loss, suicidal ideation, hallucinations,  and confusion. Heme: Denies bruising, bleeding, and enlarged lymph nodes. Neuro:  Denies any headaches, dizziness, paresthesias. Endo:  Denies any problems with DM, thyroid, adrenal function.  Physical Exam: Vital signs in last 24 hours: Temp:  [97.7 F (36.5 C)-99.4 F (37.4 C)]  98.4 F (36.9 C) (06/04 0953) Pulse Rate:  [84-103] 85 (06/04 0953) Resp:  [16-23] 18 (06/04 0953) BP: (113-149)/(56-85) 113/70 (06/04 0953) SpO2:  [96 %-100 %] 97 % (06/04 0953) Weight:  [70.3 kg] 70.3 kg (06/03 1405) Last BM Date : 08/06/21  General:   Alert,  Well-developed, well-nourished, pleasant and cooperative in NAD Head:  Normocephalic and atraumatic. Eyes:  Sclera clear, no icterus.   Mild pallor Ears:  Normal auditory acuity. Nose:  No deformity, discharge,  or lesions. Mouth:  No deformity or lesions.  Oropharynx pink & moist. Neck:  Supple; no masses or thyromegaly. Lungs:  Clear throughout to auscultation.   No wheezes, crackles, or rhonchi. No acute distress. Heart:  Regular rate and rhythm; no murmurs, clicks, rubs,  or gallops. Extremities:  Without clubbing or edema. Neurologic:  Alert and  oriented x4;  grossly normal neurologically. Skin:  Intact without significant lesions or rashes. Psych:  Alert and cooperative. Normal mood and affect. Abdomen:  Soft, nontender and nondistended.  No masses, hepatosplenomegaly or hernias noted. Normal bowel sounds, without guarding, and without rebound.         Lab Results: Recent Labs    08/06/21 1414 08/07/21 0324  WBC 8.4 7.0  HGB 10.5* 8.1*  HCT 33.9* 27.6*  PLT 464* 326   BMET Recent Labs    08/06/21 1414 08/07/21 0324  NA 137 139  K 4.1 3.5  CL 104 107  CO2 25 26  GLUCOSE 95 90  BUN 14 10  CREATININE 0.81 0.90  CALCIUM 9.3 8.5*   LFT Recent Labs    08/07/21 0324  PROT 6.5  ALBUMIN 2.7*  AST 7*  ALT 7  ALKPHOS 77  BILITOT 0.4   PT/INR No results for input(s): LABPROT, INR in the last 72 hours.  Studies/Results: CT Abdomen Pelvis W Contrast  Result Date: 08/06/2021 CLINICAL DATA:  Abdominal pain and nausea and vomiting for 1 week. Crohn disease. EXAM: CT ABDOMEN AND PELVIS WITH CONTRAST TECHNIQUE: Multidetector CT imaging of the abdomen and pelvis was performed using the standard protocol  following bolus administration of intravenous contrast. RADIATION DOSE REDUCTION: This exam was performed according to the departmental dose-optimization program which includes automated exposure control, adjustment of the mA and/or kV according to patient size and/or use of iterative reconstruction technique. CONTRAST:  OMNIPAQUE IOHEXOL 300 MG/ML  SOLN COMPARISON:  Noncontrast CT on 05/12/2020 FINDINGS: Lower Chest: No acute findings. Hepatobiliary: No hepatic masses identified. Cholelithiasis is noted, however there is no evidence of cholecystitis or biliary ductal dilatation. Pancreas:  No mass or inflammatory changes. Spleen: Within normal limits in size and appearance. Adrenals/Urinary Tract: No masses identified. No evidence of ureteral calculi or hydronephrosis. Stomach/Bowel: Changes of chronic colitis are again seen involving the left colon. Superimposed acute colitis is seen in the mid sigmoid colon, with a pericolonic abscess measuring 3.0 x 2.8 cm. No evidence of free intraperitoneal air. Reactive thickening adjacent small bowel loop is seen in the left lower quadrant, however there is no evidence of bowel obstruction. No evidence of diverticular disease seen involving the colon. Terminal ileum is unremarkable in appearance. Vascular/Lymphatic: No pathologically enlarged lymph nodes. No acute vascular findings. Reproductive:  No mass or other significant abnormality. Other:  None. Musculoskeletal:  No suspicious bone lesions identified. IMPRESSION: Acute on chronic colitis involving the mid sigmoid colon, with 3 cm pericolonic abscess. No evidence of diverticular disease or free intraperitoneal air. Reactive wall thickening of adjacent small bowel in left lower quadrant. No evidence of bowel obstruction. Cholelithiasis. No radiographic evidence of cholecystitis. Electronically Signed   By: Danae Orleans M.D.   On: 08/06/2021 16:46    Impression: 3 x 2.8 cm pericolonic abscess with left-sided  colitis  History of Crohn's disease, diagnosed at age 75, previously on Asacol and Remicade, without any medication for Crohn's for at least 9 years  Normocytic anemia, hemoglobin 8.1, platelet 326, MCV 81.4 Low albumin 2.7  Plan: Patient scheduled for CT-guided percutaneous drainage by IR and has been started on Zosyn 3.375 g every 8 hours. Hopefully status post drainage of abscess, patient can be resumed on regular diet. She will likely be discharged on oral antibiotics, either Augmentin or Cipro/Flagyl. More importantly, she will need to see GI as an outpatient for management of Crohn's colitis. I had a detailed discussion with the patient and her husband at bedside, that Crohn's disease is a chronic condition, and she will require to be on a maintenance therapy, either immunomodulators, mesalamine or Biologics. I cannot  start the patient on oral steroids with an ongoing abscess. Recommend to treat abscess first, with drainage and antibiotics, repeat CT of the abdomen in 2 to 3 weeks, once abscess is resolved, she will need to be on therapy for Crohn's colitis. We will continue to follow.   LOS: 1 day   Kerin Salen, MD  08/07/2021, 10:38 AM

## 2021-08-07 NOTE — Consult Note (Signed)
CC: colitis with intra-abdominal abscess  HPI: Laura Hebert is an 46 y.o. female who was transferred here from med [facility] late yesterday.  She has a history of chronic disease but has not been on medication or had flareups in several years.  She reports that about a week ago she started having intermittent feelings of being cold and being hot.  She then on Friday started developing sharp abdominal pain going from the epigastrium to the left lower quadrant.  She had 1 episode of nausea and vomiting.  She reports her bowel movements were normal.  After a CAT scan of the abdomen pelvis showed acute on chronic colitis with an intra-abdominal abscess, she was transferred here for further evaluation.  Her pain has improved somewhat with pain medication.  She is otherwise without complaints.  Past Medical History:  Diagnosis Date   Bipolar disorder (HCC)    Crohn's disease (HCC)    Kidney stone     Past Surgical History:  Procedure Laterality Date   APPENDECTOMY     CESAREAN SECTION      History reviewed. No pertinent family history.  Social:  reports that she has never smoked. She has never used smokeless tobacco. She reports current alcohol use. She reports that she does not use drugs.  Allergies:  Allergies  Allergen Reactions   Latex Rash   Penicillins    Nylon Rash    Medications: I have reviewed the patient's current medications.  Results for orders placed or performed during the hospital encounter of 08/06/21 (from the past 48 hour(s))  Urinalysis, Routine w reflex microscopic Urine, Clean Catch     Status: Abnormal   Collection Time: 08/06/21  2:14 PM  Result Value Ref Range   Color, Urine YELLOW YELLOW   APPearance HAZY (A) CLEAR   Specific Gravity, Urine 1.020 1.005 - 1.030   pH 7.0 5.0 - 8.0   Glucose, UA NEGATIVE NEGATIVE mg/dL   Hgb urine dipstick NEGATIVE NEGATIVE   Bilirubin Urine SMALL (A) NEGATIVE   Ketones, ur NEGATIVE NEGATIVE mg/dL    Protein, ur 30 (A) NEGATIVE mg/dL   Nitrite NEGATIVE NEGATIVE   Leukocytes,Ua NEGATIVE NEGATIVE    Comment: Performed at [facility], [address] [place], [place] [address]  CBC with Differential     Status: Abnormal   Collection Time: 08/06/21  2:14 PM  Result Value Ref Range   WBC 8.4 4.0 - 10.5 K/uL   RBC 4.37 3.87 - 5.11 MIL/uL   Hemoglobin 10.5 (L) 12.0 - 15.0 g/dL   HCT 60.4 (L) 54.0 - 98.1 %   MCV 77.6 (L) 80.0 - 100.0 fL   MCH 24.0 (L) 26.0 - 34.0 pg   MCHC 31.0 30.0 - 36.0 g/dL   RDW 19.1 47.8 - 29.5 %   Platelets 464 (H) 150 - 400 K/uL   nRBC 0.0 0.0 - 0.2 %   Neutrophils Relative % 91 %   Neutro Abs 7.6 1.7 - 7.7 K/uL   Lymphocytes Relative 4 %   Lymphs Abs 0.4 (L) 0.7 - 4.0 K/uL   Monocytes Relative 5 %   Monocytes Absolute 0.4 0.1 - 1.0 K/uL   Eosinophils Relative 0 %   Eosinophils Absolute 0.0 0.0 - 0.5 K/uL   Basophils Relative 0 %   Basophils Absolute 0.0 0.0 - 0.1 K/uL   Immature Granulocytes 0 %   Abs Immature Granulocytes 0.03 0.00 - 0.07 K/uL    Comment: Performed at [facility], [address] [place], [place] [address]  Comprehensive metabolic panel     Status: Abnormal   Collection Time: 08/06/21  2:14 PM  Result Value Ref Range   Sodium 137 135 - 145 mmol/L   Potassium 4.1 3.5 - 5.1 mmol/L   Chloride 104 98 - 111 mmol/L   CO2 25 22 - 32 mmol/L   Glucose, Bld 95 70 - 99 mg/dL    Comment: Glucose reference range applies only to samples taken after fasting for at least 8 hours.   BUN 14 6 - 20 mg/dL   Creatinine, Ser 9.47 0.44 - 1.00 mg/dL   Calcium 9.3 8.9 - 07.6 mg/dL   Total Protein 8.4 (H) 6.5 - 8.1 g/dL   Albumin 3.6 3.5 - 5.0 g/dL   AST 10 (L) 15 - 41 U/L   ALT 8 0 - 44 U/L   Alkaline Phosphatase 104 38 - 126 U/L   Total Bilirubin 0.3 0.3 - 1.2 mg/dL   GFR, Estimated >15 >18 mL/min    Comment: (NOTE) Calculated using the CKD-EPI Creatinine Equation (2021)    Anion gap 8 5 - 15    Comment: Performed at [facility], [address] [place], [place] [address]  Lipase, blood     Status: None   Collection Time: 08/06/21  2:14 PM  Result Value Ref Range   Lipase 27 11 - 51 U/L    Comment: Performed at [facility], [address] [place], [place] [address]  Urinalysis, Microscopic (reflex)     Status: Abnormal   Collection Time: 08/06/21  2:14 PM  Result Value Ref Range   RBC / HPF NONE SEEN 0 - 5 RBC/hpf   WBC, UA 0-5 0 - 5 WBC/hpf   Bacteria, UA MANY (A) NONE SEEN   Squamous Epithelial / LPF 6-10 0 - 5   Mucus PRESENT     Comment: Performed at [facility], [address] [place], [place] [address]  Pregnancy, urine     Status: None   Collection Time: 08/06/21  2:14 PM  Result Value Ref Range   Preg Test, Ur NEGATIVE NEGATIVE    Comment:        THE SENSITIVITY OF THIS METHODOLOGY IS >20 mIU/mL. Performed at [facility] [address] [place], [place] [address]   CBC     Status: Abnormal   Collection Time: 08/07/21  3:24 AM  Result Value Ref Range   WBC 7.0 4.0 - 10.5 K/uL   RBC 3.39 (L) 3.87 - 5.11 MIL/uL   Hemoglobin 8.1 (L) 12.0 - 15.0 g/dL   HCT 81.3 (L) 88.7 - 19.5 %   MCV 81.4 80.0 - 100.0 fL   MCH 23.9 (L) 26.0 - 34.0 pg   MCHC 29.3 (L) 30.0 - 36.0 g/dL   RDW 97.4 71.8 - 55.0 %   Platelets 326 150 - 400 K/uL   nRBC 0.0 0.0 - 0.2 %    Comment: Performed at [facility], [address], [place], [place] [address]  Comprehensive metabolic panel     Status: Abnormal   Collection Time: 08/07/21  3:24 AM  Result Value Ref Range   Sodium 139 135 - 145 mmol/L   Potassium 3.5 3.5 - 5.1 mmol/L   Chloride 107 98 - 111 mmol/L   CO2 26 22 - 32 mmol/L   Glucose, Bld 90 70 - 99  mg/dL    Comment: Glucose reference range applies only to samples taken after fasting for at least 8 hours.   BUN 10 6 - 20 mg/dL   Creatinine, Ser 1.61 0.44 - 1.00 mg/dL   Calcium 8.5 (L) 8.9 - 10.3 mg/dL   Total Protein 6.5 6.5 - 8.1  g/dL   Albumin 2.7 (L) 3.5 - 5.0 g/dL   AST 7 (L) 15 - 41 U/L   ALT 7 0 - 44 U/L   Alkaline Phosphatase 77 38 - 126 U/L   Total Bilirubin 0.4 0.3 - 1.2 mg/dL   GFR, Estimated >09 >60 mL/min    Comment: (NOTE) Calculated using the CKD-EPI Creatinine Equation (2021)    Anion gap 6 5 - 15    Comment: Performed at [facility], [address], [place], [place] [address]    CT Abdomen Pelvis W Contrast  Result Date: 08/06/2021 CLINICAL DATA:  Abdominal pain and nausea and vomiting for 1 week. Crohn disease. EXAM: CT ABDOMEN AND PELVIS WITH CONTRAST TECHNIQUE: Multidetector CT imaging of the abdomen and pelvis was performed using the standard protocol following bolus administration of intravenous contrast. RADIATION DOSE REDUCTION: This exam was performed according to the departmental dose-optimization program which includes automated exposure control, adjustment of the mA and/or kV according to patient size and/or use of iterative reconstruction technique. CONTRAST:  OMNIPAQUE IOHEXOL 300 MG/ML  SOLN COMPARISON:  Noncontrast CT on 05/12/2020 FINDINGS: Lower Chest: No acute findings. Hepatobiliary: No hepatic masses identified. Cholelithiasis is noted, however there is no evidence of cholecystitis or biliary ductal dilatation. Pancreas:  No mass or inflammatory changes. Spleen: Within normal limits in size and appearance. Adrenals/Urinary Tract: No masses identified. No evidence of ureteral calculi or hydronephrosis. Stomach/Bowel: Changes of chronic colitis are again seen involving the left colon. Superimposed acute colitis is seen in the mid sigmoid colon, with a pericolonic abscess measuring 3.0 x 2.8 cm. No evidence of free intraperitoneal air. Reactive thickening adjacent small bowel loop is seen in the left lower quadrant, however there is no evidence of bowel obstruction. No evidence of diverticular disease seen involving the colon. Terminal ileum is unremarkable in  appearance. Vascular/Lymphatic: No pathologically enlarged lymph nodes. No acute vascular findings. Reproductive:  No mass or other significant abnormality. Other:  None. Musculoskeletal:  No suspicious bone lesions identified. IMPRESSION: Acute on chronic colitis involving the mid sigmoid colon, with 3 cm pericolonic abscess. No evidence of diverticular disease or free intraperitoneal air. Reactive wall thickening of adjacent small bowel in left lower quadrant. No evidence of bowel obstruction. Cholelithiasis. No radiographic evidence of cholecystitis. Electronically Signed   By: Danae Orleans M.D.   On: 08/06/2021 16:46    ROS - all of the below systems have been reviewed with the patient and positives are indicated with bold text General: chills, fever or night sweats Eyes: blurry vision or double vision ENT: epistaxis or sore throat Allergy/Immunology: itchy/watery eyes or nasal congestion Hematologic/Lymphatic: bleeding problems, blood clots or swollen lymph nodes Endocrine: temperature intolerance or unexpected weight changes Breast: new or changing breast lumps or nipple discharge Resp: cough, shortness of breath, or wheezing CV: chest pain or dyspnea on exertion GI: as per HPI GU: dysuria, trouble voiding, or hematuria MSK: joint pain or joint stiffness Neuro: TIA or stroke symptoms Derm: pruritus and skin lesion changes Psych: anxiety and depression  PE Blood pressure (!) 149/82, pulse 84, temperature 97.7 F (36.5 C), temperature source Oral, resp. rate 18, height  (  1.549 m), weight 70.3 kg, SpO2 96 %. Constitutional: NAD; conversant; no deformities Eyes: Moist conjunctiva; no lid lag; anicteric; PERRL Neck: Trachea midline; no thyromegaly Lungs: Normal respiratory effort; CV: RRR;  GI: Abd tenderness with guarding which is fairly moderate in the left lower quadrant; no palpable hepatosplenomegaly MSK: Normal range of motion of extremities; no clubbing/cyanosis Psychiatric:  Appropriate affect; alert and oriented x3   Results for orders placed or performed during the hospital encounter of 08/06/21 (from the past 48 hour(s))  Urinalysis, Routine w reflex microscopic Urine, Clean Catch     Status: Abnormal   Collection Time: 08/06/21  2:14 PM  Result Value Ref Range   Color, Urine YELLOW YELLOW   APPearance HAZY (A) CLEAR   Specific Gravity, Urine 1.020 1.005 - 1.030   pH 7.0 5.0 - 8.0   Glucose, UA NEGATIVE NEGATIVE mg/dL   Hgb urine dipstick NEGATIVE NEGATIVE   Bilirubin Urine SMALL (A) NEGATIVE   Ketones, ur NEGATIVE NEGATIVE mg/dL   Protein, ur 30 (A) NEGATIVE mg/dL   Nitrite NEGATIVE NEGATIVE   Leukocytes,Ua NEGATIVE NEGATIVE    Comment: Performed at [facility], [address] [place], [place] [address]  CBC with Differential     Status: Abnormal   Collection Time: 08/06/21  2:14 PM  Result Value Ref Range   WBC 8.4 4.0 - 10.5 K/uL   RBC 4.37 3.87 - 5.11 MIL/uL   Hemoglobin 10.5 (L) 12.0 - 15.0 g/dL   HCT 17.5 (L) 10.2 - 58.5 %   MCV 77.6 (L) 80.0 - 100.0 fL   MCH 24.0 (L) 26.0 - 34.0 pg   MCHC 31.0 30.0 - 36.0 g/dL   RDW 27.7 82.4 - 23.5 %   Platelets 464 (H) 150 - 400 K/uL   nRBC 0.0 0.0 - 0.2 %   Neutrophils Relative % 91 %   Neutro Abs 7.6 1.7 - 7.7 K/uL   Lymphocytes Relative 4 %   Lymphs Abs 0.4 (L) 0.7 - 4.0 K/uL   Monocytes Relative 5 %   Monocytes Absolute 0.4 0.1 - 1.0 K/uL   Eosinophils Relative 0 %   Eosinophils Absolute 0.0 0.0 - 0.5 K/uL   Basophils Relative 0 %   Basophils Absolute 0.0 0.0 - 0.1 K/uL   Immature Granulocytes 0 %   Abs Immature Granulocytes 0.03 0.00 - 0.07 K/uL    Comment: Performed at [facility], [address] [place], [place] [address]  Comprehensive metabolic panel     Status: Abnormal   Collection Time: 08/06/21  2:14 PM  Result Value Ref Range   Sodium 137 135 - 145 mmol/L   Potassium 4.1 3.5 - 5.1 mmol/L   Chloride 104 98 - 111 mmol/L   CO2 25 22 - 32 mmol/L    Glucose, Bld 95 70 - 99 mg/dL    Comment: Glucose reference range applies only to samples taken after fasting for at least 8 hours.   BUN 14 6 - 20 mg/dL   Creatinine, Ser 3.15 0.44 - 1.00 mg/dL   Calcium 9.3 8.9 - 40.0 mg/dL   Total Protein 8.4 (H) 6.5 - 8.1 g/dL   Albumin 3.6 3.5 - 5.0 g/dL   AST 10 (L) 15 - 41 U/L   ALT 8 0 - 44 U/L   Alkaline Phosphatase 104 38 - 126 U/L   Total Bilirubin 0.3 0.3 - 1.2 mg/dL   GFR, Estimated >86 >76 mL/min    Comment: (NOTE) Calculated  using the CKD-EPI Creatinine Equation (2021)    Anion gap 8 5 - 15    Comment: Performed at [facility], [address] [place], [place] [address]  Lipase, blood     Status: None   Collection Time: 08/06/21  2:14 PM  Result Value Ref Range   Lipase 27 11 - 51 U/L    Comment: Performed at [facility], [address] [place], [place] [address]  Urinalysis, Microscopic (reflex)     Status: Abnormal   Collection Time: 08/06/21  2:14 PM  Result Value Ref Range   RBC / HPF NONE SEEN 0 - 5 RBC/hpf   WBC, UA 0-5 0 - 5 WBC/hpf   Bacteria, UA MANY (A) NONE SEEN   Squamous Epithelial / LPF 6-10 0 - 5   Mucus PRESENT     Comment: Performed at [facility], [address] [place], [place] [address]  Pregnancy, urine     Status: None   Collection Time: 08/06/21  2:14 PM  Result Value Ref Range   Preg Test, Ur NEGATIVE NEGATIVE    Comment:        THE SENSITIVITY OF THIS METHODOLOGY IS >20 mIU/mL. Performed at [facility], [address] [place], [place] [address]   CBC     Status: Abnormal   Collection Time: 08/07/21  3:24 AM  Result Value Ref Range   WBC 7.0 4.0 - 10.5 K/uL   RBC 3.39 (L) 3.87 - 5.11 MIL/uL   Hemoglobin 8.1 (L) 12.0 - 15.0 g/dL   HCT 65.7 (L) 84.6 - 96.2 %   MCV 81.4 80.0 - 100.0 fL   MCH 23.9 (L) 26.0 - 34.0 pg   MCHC 29.3 (L) 30.0 - 36.0 g/dL   RDW 95.2 84.1 - 32.4 %   Platelets 326 150 - 400 K/uL   nRBC 0.0 0.0 - 0.2 %    Comment:  Performed at [facility], [address], [place], [place] [address]  Comprehensive metabolic panel     Status: Abnormal   Collection Time: 08/07/21  3:24 AM  Result Value Ref Range   Sodium 139 135 - 145 mmol/L   Potassium 3.5 3.5 - 5.1 mmol/L   Chloride 107 98 - 111 mmol/L   CO2 26 22 - 32 mmol/L   Glucose, Bld 90 70 - 99 mg/dL    Comment: Glucose reference range applies only to samples taken after fasting for at least 8 hours.   BUN 10 6 - 20 mg/dL   Creatinine, Ser 7.25 0.44 - 1.00 mg/dL   Calcium 8.5 (L) 8.9 - 10.3 mg/dL   Total Protein 6.5 6.5 - 8.1 g/dL   Albumin 2.7 (L) 3.5 - 5.0 g/dL   AST 7 (L) 15 - 41 U/L   ALT 7 0 - 44 U/L   Alkaline Phosphatase 77 38 - 126 U/L   Total Bilirubin 0.4 0.3 - 1.2 mg/dL   GFR, Estimated >36 >64 mL/min    Comment: (NOTE) Calculated using the CKD-EPI Creatinine Equation (2021)    Anion gap 6 5 - 15    Comment: Performed at [facility], [address] [place], [place] [address]    CT Abdomen Pelvis W Contrast  Result Date: 08/06/2021 CLINICAL DATA:  Abdominal pain and nausea and vomiting for 1 week. Crohn disease. EXAM: CT ABDOMEN AND PELVIS WITH CONTRAST TECHNIQUE: Multidetector CT imaging of the abdomen and  pelvis was performed using the standard protocol following bolus administration of intravenous contrast. RADIATION DOSE REDUCTION: This exam was performed according to the departmental dose-optimization program which includes automated exposure control, adjustment of the mA and/or kV according to patient size and/or use of iterative reconstruction technique. CONTRAST:  OMNIPAQUE IOHEXOL 300 MG/ML  SOLN COMPARISON:  Noncontrast CT on 05/12/2020 FINDINGS: Lower Chest: No acute findings. Hepatobiliary: No hepatic masses identified. Cholelithiasis is noted, however there is no evidence of cholecystitis or biliary ductal dilatation. Pancreas:  No mass or inflammatory changes. Spleen: Within normal limits  in size and appearance. Adrenals/Urinary Tract: No masses identified. No evidence of ureteral calculi or hydronephrosis. Stomach/Bowel: Changes of chronic colitis are again seen involving the left colon. Superimposed acute colitis is seen in the mid sigmoid colon, with a pericolonic abscess measuring 3.0 x 2.8 cm. No evidence of free intraperitoneal air. Reactive thickening adjacent small bowel loop is seen in the left lower quadrant, however there is no evidence of bowel obstruction. No evidence of diverticular disease seen involving the colon. Terminal ileum is unremarkable in appearance. Vascular/Lymphatic: No pathologically enlarged lymph nodes. No acute vascular findings. Reproductive:  No mass or other significant abnormality. Other:  None. Musculoskeletal:  No suspicious bone lesions identified. IMPRESSION: Acute on chronic colitis involving the mid sigmoid colon, with 3 cm pericolonic abscess. No evidence of diverticular disease or free intraperitoneal air. Reactive wall thickening of adjacent small bowel in left lower quadrant. No evidence of bowel obstruction. Cholelithiasis. No radiographic evidence of cholecystitis. Electronically Signed   By: Danae Orleans M.D.   On: 08/06/2021 16:46    I have personally reviewed the relevant CT scan and laboratory data  A/P: Crohn's colitis with intra-abdominal abscess  I discussed the diagnosis to the patient and her family.  Based on her exam, she does not need acute surgical intervention.  We will put in a consult for IR to review the scan but the abscess may not be amenable to percutaneous drainage.  She is currently on IV antibiotics and we will continue conservative management for now.  Gastroenterology will also be evaluating the patient regarding her chronic disease.  If she fails to improve or acutely worsens, I explained to her that she would likely need surgery which would necessitate a resection and colostomy.  Hopefully this will improve with  conservative measures.  They understand and agree with the plans.  Complex medical decision making   Abigail Miyamoto, MD [facility], [facility]

## 2021-08-07 NOTE — Progress Notes (Signed)
PROGRESS NOTE    Laura Hebert  ATF:[id number] DOB: 17-Oct-1975 DOA: 08/06/2021 PCP: Jamal Collin, PA-C  Outpatient Specialists:     Brief Narrative:  Patient is a 46 year old female with history of chronic disease, and nephrolithiasis, depression and bipolar disorder.  Patient has been admitted with mid sigmoid colitis with 3 cm pericolonic abscess.  Patient is currently on IV Zosyn.  GI and interventional radiology teams have been consulted.  Interventional radiology team plans to try drainage of the abscess tomorrow, 08/08/2021.  Patient continues to have intermittent left-sided lower abdominal pain.    Assessment & Plan:   Principal Problem:   Intra-abdominal abscess (HCC) Active Problems:   Crohn's disease (HCC)   Colitis   Depression  Acute on chronic colitis 3 mm pericolonic abscess -In the background of history of Crohn's disease, reportedly in remission for> 10 years -Continue IV Zosyn, IV fluids -Clear liquids, supportive care, antiemetics and pain control -GI is following patient. -Interventional radiology team will try abscess drainage tomorrow. -Surgical input is appreciated.   Cholelithiasis -Asymptomatic.   Chronic anemia -Stable, monitor    Depression, bipolar disorder -Resume home regimen of carbamazepine and Lamictal   DVT prophylaxis: Subcutaneous Lovenox Code Status: Full code Family Communication:  Disposition Plan: Home eventually   Consultants:  GI Interventional radiology  Procedures:  We will drainage of the abscess tomorrow, 08/08/2021  Antimicrobials:  IV Zosyn   Subjective: Intermittent left lower quadrant abdominal pain.  Objective: Vitals:   08/06/21 2021 08/07/21 0051 08/07/21 0612 08/07/21 0953  BP: (!) 148/75 (!) 149/82 122/63 113/70  Pulse: 89 84 90 85  Resp: 16 18 18 18   Temp: 99.4 F (37.4 C) 97.7 F (36.5 C) 98.1 F (36.7 C) 98.4 F (36.9 C)  TempSrc: Oral Oral Oral Oral  SpO2: 96% 96% 96% 97%  Weight:       Height:        Intake/Output Summary (Last 24 hours) at 08/07/2021 1028 Last data filed at 08/07/2021 0620 Gross per 24 hour  Intake 1580.4 ml  Output 300 ml  Net 1280.4 ml   Filed Weights   08/06/21 1405  Weight: 70.3 kg    Examination:  General exam: Appears calm and comfortable  Respiratory system: Clear to auscultation.  Cardiovascular system: S1 & S2 heard. Gastrointestinal system: Vague tenderness left lower quadrant abdominal area.   Central nervous system: Alert and oriented. No focal neurological deficits. Extremities: No leg edema.  Data Reviewed: I have personally reviewed following labs and imaging studies  CBC: Recent Labs  Lab 08/06/21 1414 08/07/21 0324  WBC 8.4 7.0  NEUTROABS 7.6  --   HGB 10.5* 8.1*  HCT 33.9* 27.6*  MCV 77.6* 81.4  PLT 464* 326   Basic Metabolic Panel: Recent Labs  Lab 08/06/21 1414 08/07/21 0324  NA 137 139  K 4.1 3.5  CL 104 107  CO2 25 26  GLUCOSE 95 90  BUN 14 10  CREATININE 0.81 0.90  CALCIUM 9.3 8.5*   GFR: Estimated Creatinine Clearance: 72.3 mL/min (by C-G formula based on SCr of 0.9 mg/dL). Liver Function Tests: Recent Labs  Lab 08/06/21 1414 08/07/21 0324  AST 10* 7*  ALT 8 7  ALKPHOS 104 77  BILITOT 0.3 0.4  PROT 8.4* 6.5  ALBUMIN 3.6 2.7*   Recent Labs  Lab 08/06/21 1414  LIPASE 27   No results for input(s): AMMONIA in the last 168 hours. Coagulation Profile: No results for input(s): INR, PROTIME in the last 168 hours. Cardiac  Enzymes: No results for input(s): CKTOTAL, CKMB, CKMBINDEX, TROPONINI in the last 168 hours. BNP (last 3 results) No results for input(s): PROBNP in the last 8760 hours. HbA1C: No results for input(s): HGBA1C in the last 72 hours. CBG: No results for input(s): GLUCAP in the last 168 hours. Lipid Profile: No results for input(s): CHOL, HDL, LDLCALC, TRIG, CHOLHDL, LDLDIRECT in the last 72 hours. Thyroid Function Tests: No results for input(s): TSH, T4TOTAL, FREET4,  T3FREE, THYROIDAB in the last 72 hours. Anemia Panel: No results for input(s): VITAMINB12, FOLATE, FERRITIN, TIBC, IRON, RETICCTPCT in the last 72 hours. Urine analysis:    Component Value Date/Time   COLORURINE YELLOW 08/06/2021 1414   APPEARANCEUR HAZY (A) 08/06/2021 1414   LABSPEC 1.020 08/06/2021 1414   PHURINE 7.0 08/06/2021 1414   GLUCOSEU NEGATIVE 08/06/2021 1414   HGBUR NEGATIVE 08/06/2021 1414   BILIRUBINUR SMALL (A) 08/06/2021 1414   KETONESUR NEGATIVE 08/06/2021 1414   PROTEINUR 30 (A) 08/06/2021 1414   NITRITE NEGATIVE 08/06/2021 1414   LEUKOCYTESUR NEGATIVE 08/06/2021 1414   Sepsis Labs: @LABRCNTIP (procalcitonin:4,lacticidven:4)  )No results found for this or any previous visit (from the past 240 hour(s)).       Radiology Studies: CT Abdomen Pelvis W Contrast  Result Date: 08/06/2021 CLINICAL DATA:  Abdominal pain and nausea and vomiting for 1 week. Crohn disease. EXAM: CT ABDOMEN AND PELVIS WITH CONTRAST TECHNIQUE: Multidetector CT imaging of the abdomen and pelvis was performed using the standard protocol following bolus administration of intravenous contrast. RADIATION DOSE REDUCTION: This exam was performed according to the departmental dose-optimization program which includes automated exposure control, adjustment of the mA and/or kV according to patient size and/or use of iterative reconstruction technique. CONTRAST:  10/06/2021 OMNIPAQUE IOHEXOL 300 MG/ML  SOLN COMPARISON:  Noncontrast CT on 05/12/2020 FINDINGS: Lower Chest: No acute findings. Hepatobiliary: No hepatic masses identified. Cholelithiasis is noted, however there is no evidence of cholecystitis or biliary ductal dilatation. Pancreas:  No mass or inflammatory changes. Spleen: Within normal limits in size and appearance. Adrenals/Urinary Tract: No masses identified. No evidence of ureteral calculi or hydronephrosis. Stomach/Bowel: Changes of chronic colitis are again seen involving the left colon. Superimposed  acute colitis is seen in the mid sigmoid colon, with a pericolonic abscess measuring 3.0 x 2.8 cm. No evidence of free intraperitoneal air. Reactive thickening adjacent small bowel loop is seen in the left lower quadrant, however there is no evidence of bowel obstruction. No evidence of diverticular disease seen involving the colon. Terminal ileum is unremarkable in appearance. Vascular/Lymphatic: No pathologically enlarged lymph nodes. No acute vascular findings. Reproductive:  No mass or other significant abnormality. Other:  None. Musculoskeletal:  No suspicious bone lesions identified. IMPRESSION: Acute on chronic colitis involving the mid sigmoid colon, with 3 cm pericolonic abscess. No evidence of diverticular disease or free intraperitoneal air. Reactive wall thickening of adjacent small bowel in left lower quadrant. No evidence of bowel obstruction. Cholelithiasis. No radiographic evidence of cholecystitis. Electronically Signed   By: 07/12/2020 M.D.   On: 08/06/2021 16:46        Scheduled Meds:  carbamazepine  600 mg Oral QPM   enoxaparin (LOVENOX) injection  40 mg Subcutaneous Q24H   lamoTRIgine  200 mg Oral Daily   Continuous Infusions:  sodium chloride 10 mL/hr at 08/06/21 1749   sodium chloride 75 mL/hr at 08/07/21 0838   piperacillin-tazobactam (ZOSYN)  IV 3.375 g (08/07/21 0927)     LOS: 1 day    10/07/21, MD  [facility] Pager #: [phone] 7PM-7AM contact night coverage as above

## 2021-08-07 NOTE — Progress Notes (Signed)
IR received request to evaluate this patient for possible abscess aspiration/drain. Imaging reviewed by Dr. Pascal Lux who states he is unsure if patient has a percutaneous window for access. IR will have tomorrow's MD (Dr. Maryelizabeth Kaufmann) review the imaging in the morning and see if he would like to re-scan/attempt drain placement.   Patient has been made NPO, CBC and PT/INR orders are in place for the morning. Lovenox will be held. If approved for procedure patient will need to be seen/consented.   Soyla Dryer, [place] [phone] 08/07/2021, 10:49 AM

## 2021-08-08 DIAGNOSIS — K651 Peritoneal abscess: Secondary | ICD-10-CM | POA: Diagnosis not present

## 2021-08-08 LAB — CBC
HCT: 25.3 % — ABNORMAL LOW (ref 36.0–46.0)
Hemoglobin: 7.4 g/dL — ABNORMAL LOW (ref 12.0–15.0)
MCH: 23.8 pg — ABNORMAL LOW (ref 26.0–34.0)
MCHC: 29.2 g/dL — ABNORMAL LOW (ref 30.0–36.0)
MCV: 81.4 fL (ref 80.0–100.0)
Platelets: 292 10*3/uL (ref 150–400)
RBC: 3.11 MIL/uL — ABNORMAL LOW (ref 3.87–5.11)
RDW: 14.8 % (ref 11.5–15.5)
WBC: 4.8 10*3/uL (ref 4.0–10.5)
nRBC: 0 % (ref 0.0–0.2)

## 2021-08-08 LAB — PROTIME-INR
INR: 1.1 (ref 0.8–1.2)
Prothrombin Time: 14.4 seconds (ref 11.4–15.2)

## 2021-08-08 LAB — URINE CULTURE: Culture: NO GROWTH

## 2021-08-08 LAB — GLUCOSE, CAPILLARY: Glucose-Capillary: 93 mg/dL (ref 70–99)

## 2021-08-08 MED ORDER — MESALAMINE 1.2 G PO TBEC
4.8000 g | DELAYED_RELEASE_TABLET | Freq: Every day | ORAL | Status: DC
  Administered 2021-08-08 – 2021-08-10 (×3): 4.8 g via ORAL
  Filled 2021-08-08 (×3): qty 4

## 2021-08-08 NOTE — Progress Notes (Signed)
PROGRESS NOTE    Laura Hebert  IZT:[id number] DOB: 12/14/1975 DOA: 08/06/2021 PCP: Jamal Collin, PA-C       Brief Narrative:  Patient is a 46 year old female with history of chronic disease, and nephrolithiasis, depression and bipolar disorder.  Patient has been admitted with mid sigmoid colitis with 3 cm pericolonic abscess.  Patient is currently on IV Zosyn.  Abscess too small and deep to drain- plan to repeat imagine in a few days.  Patient will also need GI follow up outpatient.    Assessment & Plan:   Principal Problem:   Intra-abdominal abscess (HCC) Active Problems:   Crohn's disease (HCC)   Colitis   Depression   Acute on chronic colitis 3 mm pericolonic abscess -In the background of history of Crohn's disease, reportedly in remission for> 10 years -Continue IV Zosyn -advance diet -GI is following patient. -Interventional radiology team to follow with repeat imaging -Surgical input is appreciated.   Cholelithiasis -Asymptomatic.   Chronic anemia -patient started period on 6/4 so suspected drop in hgb -check Fe studies    Depression, bipolar disorder -Resume home regimen of carbamazepine and Lamictal   DVT prophylaxis: ambulate Code Status: Full code Family Communication:  Disposition Plan: Home eventually   Consultants:  GI Interventional radiology GS     Subjective: Has graduation this weekend on Saturday   Objective: Vitals:   08/07/21 1356 08/07/21 2157 08/08/21 0546 08/08/21 0830  BP: 114/71 130/84 113/66 122/72  Pulse: 88 85 84 81  Resp: 16 16 20 18   Temp: 98.1 F (36.7 C) 98.4 F (36.9 C) 98.1 F (36.7 C) 98.1 F (36.7 C)  TempSrc: Oral Oral Oral Oral  SpO2: 95% 96% 97% 96%  Weight:      Height:        Intake/Output Summary (Last 24 hours) at 08/08/2021 1229 Last data filed at 08/08/2021 1000 Gross per 24 hour  Intake 2233.44 ml  Output --  Net 2233.44 ml   Filed Weights   08/06/21 1405  Weight: 70.3 kg     Examination:   General: Appearance:     Overweight female in no acute distress     Lungs:     respirations unlabored  Heart:    Normal heart rate.   MS:   All extremities are intact.   Neurologic:   Awake, alert, oriented x 3. No apparent focal neurological  defect.      Data Reviewed: I have personally reviewed following labs and imaging studies  CBC: Recent Labs  Lab 08/06/21 1414 08/07/21 0324 08/08/21 0325  WBC 8.4 7.0 4.8  NEUTROABS 7.6  --   --   HGB 10.5* 8.1* 7.4*  HCT 33.9* 27.6* 25.3*  MCV 77.6* 81.4 81.4  PLT 464* 326 292   Basic Metabolic Panel: Recent Labs  Lab 08/06/21 1414 08/07/21 0324  NA 137 139  K 4.1 3.5  CL 104 107  CO2 25 26  GLUCOSE 95 90  BUN 14 10  CREATININE 0.81 0.90  CALCIUM 9.3 8.5*   GFR: Estimated Creatinine Clearance: 72.3 mL/min (by C-G formula based on SCr of 0.9 mg/dL). Liver Function Tests: Recent Labs  Lab 08/06/21 1414 08/07/21 0324  AST 10* 7*  ALT 8 7  ALKPHOS 104 77  BILITOT 0.3 0.4  PROT 8.4* 6.5  ALBUMIN 3.6 2.7*   Recent Labs  Lab 08/06/21 1414  LIPASE 27   No results for input(s): AMMONIA in the last 168 hours. Coagulation Profile: Recent Labs  Lab 08/08/21  0325  INR 1.1   Cardiac Enzymes: No results for input(s): CKTOTAL, CKMB, CKMBINDEX, TROPONINI in the last 168 hours. BNP (last 3 results) No results for input(s): PROBNP in the last 8760 hours. HbA1C: No results for input(s): HGBA1C in the last 72 hours. CBG: No results for input(s): GLUCAP in the last 168 hours. Lipid Profile: No results for input(s): CHOL, HDL, LDLCALC, TRIG, CHOLHDL, LDLDIRECT in the last 72 hours. Thyroid Function Tests: No results for input(s): TSH, T4TOTAL, FREET4, T3FREE, THYROIDAB in the last 72 hours. Anemia Panel: No results for input(s): VITAMINB12, FOLATE, FERRITIN, TIBC, IRON, RETICCTPCT in the last 72 hours. Urine analysis:    Component Value Date/Time   COLORURINE YELLOW 08/06/2021 1414    APPEARANCEUR HAZY (A) 08/06/2021 1414   LABSPEC 1.020 08/06/2021 1414   PHURINE 7.0 08/06/2021 1414   GLUCOSEU NEGATIVE 08/06/2021 1414   HGBUR NEGATIVE 08/06/2021 1414   BILIRUBINUR SMALL (A) 08/06/2021 1414   KETONESUR NEGATIVE 08/06/2021 1414   PROTEINUR 30 (A) 08/06/2021 1414   NITRITE NEGATIVE 08/06/2021 1414   LEUKOCYTESUR NEGATIVE 08/06/2021 1414     )No results found for this or any previous visit (from the past 240 hour(s)).       Radiology Studies: CT Abdomen Pelvis W Contrast  Result Date: 08/06/2021 CLINICAL DATA:  Abdominal pain and nausea and vomiting for 1 week. Crohn disease. EXAM: CT ABDOMEN AND PELVIS WITH CONTRAST TECHNIQUE: Multidetector CT imaging of the abdomen and pelvis was performed using the standard protocol following bolus administration of intravenous contrast. RADIATION DOSE REDUCTION: This exam was performed according to the departmental dose-optimization program which includes automated exposure control, adjustment of the mA and/or kV according to patient size and/or use of iterative reconstruction technique. CONTRAST:  OMNIPAQUE IOHEXOL 300 MG/ML  SOLN COMPARISON:  Noncontrast CT on 05/12/2020 FINDINGS: Lower Chest: No acute findings. Hepatobiliary: No hepatic masses identified. Cholelithiasis is noted, however there is no evidence of cholecystitis or biliary ductal dilatation. Pancreas:  No mass or inflammatory changes. Spleen: Within normal limits in size and appearance. Adrenals/Urinary Tract: No masses identified. No evidence of ureteral calculi or hydronephrosis. Stomach/Bowel: Changes of chronic colitis are again seen involving the left colon. Superimposed acute colitis is seen in the mid sigmoid colon, with a pericolonic abscess measuring 3.0 x 2.8 cm. No evidence of free intraperitoneal air. Reactive thickening adjacent small bowel loop is seen in the left lower quadrant, however there is no evidence of bowel obstruction. No evidence of diverticular  disease seen involving the colon. Terminal ileum is unremarkable in appearance. Vascular/Lymphatic: No pathologically enlarged lymph nodes. No acute vascular findings. Reproductive:  No mass or other significant abnormality. Other:  None. Musculoskeletal:  No suspicious bone lesions identified. IMPRESSION: Acute on chronic colitis involving the mid sigmoid colon, with 3 cm pericolonic abscess. No evidence of diverticular disease or free intraperitoneal air. Reactive wall thickening of adjacent small bowel in left lower quadrant. No evidence of bowel obstruction. Cholelithiasis. No radiographic evidence of cholecystitis. Electronically Signed   By: Danae Orleans M.D.   On: 08/06/2021 16:46        Scheduled Meds:  carbamazepine  400 mg Oral QHS   enoxaparin (LOVENOX) injection  40 mg Subcutaneous Q24H   lamoTRIgine  200 mg Oral QHS   Continuous Infusions:  sodium chloride 10 mL/hr at 08/06/21 1749   sodium chloride 75 mL/hr at 08/07/21 2334   piperacillin-tazobactam (ZOSYN)  IV 3.375 g (08/08/21 0937)     LOS: 2 days  Eulogio Bear DO [facility] 7PM-7AM contact night coverage as above

## 2021-08-08 NOTE — Progress Notes (Signed)
[facility] Progress Note  Laura Hebert 46 y.o. 1975/04/25  CC:  Intra-abdominal abscess   Subjective: Patient states she feels okay today. She has an appetite and would like to advance her diet. Pain still present in left lower quadrant but slightly improved.  Denies bowel movement overnight. Normal bowel movements prior to admission. Denies nausea, vomiting, fever, chills, dysuria.  ROS : Review of Systems  Constitutional:  Negative for chills and fever.  Gastrointestinal:  Positive for abdominal pain. Negative for blood in stool, constipation, diarrhea, heartburn, melena, nausea and vomiting (LLQ).  Genitourinary:  Negative for dysuria and urgency.     Objective: Vital signs in last 24 hours: Vitals:   08/08/21 0546 08/08/21 0830  BP: 113/66 122/72  Pulse: 84 81  Resp: 20 18  Temp: 98.1 F (36.7 C) 98.1 F (36.7 C)  SpO2: 97% 96%    Physical Exam:  General:  Alert, cooperative, no distress, appears stated age  Head:  Normocephalic, without obvious abnormality, atraumatic  Eyes:  Anicteric sclera, EOM's intact  Lungs:   Clear to auscultation bilaterally, respirations unlabored  Heart:  Regular rate and rhythm, S1, S2 normal  Abdomen:   Soft, tender to palpation of LLQ, no guarding, bowel sounds active all four quadrants,  no masses,   Extremities: Extremities normal, atraumatic, no  edema  Pulses: 2+ and symmetric    Lab Results: Recent Labs    08/06/21 1414 08/07/21 0324  NA 137 139  K 4.1 3.5  CL 104 107  CO2 25 26  GLUCOSE 95 90  BUN 14 10  CREATININE 0.81 0.90  CALCIUM 9.3 8.5*   Recent Labs    08/06/21 1414 08/07/21 0324  AST 10* 7*  ALT 8 7  ALKPHOS 104 77  BILITOT 0.3 0.4  PROT 8.4* 6.5  ALBUMIN 3.6 2.7*   Recent Labs    08/06/21 1414 08/07/21 0324 08/08/21 0325  WBC 8.4 7.0 4.8  NEUTROABS 7.6  --   --   HGB 10.5* 8.1* 7.4*  HCT 33.9* 27.6* 25.3*  MCV 77.6* 81.4 81.4  PLT 464* 326 292   Recent Labs     08/08/21 0325  LABPROT 14.4  INR 1.1     Assessment 3 x 2.8 cm pericolonic abscess with left-sided colitis   History of Crohn's disease, diagnosed at age 85, previously on Asacol and Remicade, without any medication for Crohn's for at least 9 years.   Normocytic anemia, hemoglobin 7.4 (8.1), platelet 292, MCV 81.4 Low albumin 2.7  Plan: IR monitoring abscess at this time which is deep and small. Opting for conservative management with repeat imaging in a few days to reassess. Continue supportive care.  Full liquids for lunch today. If patient tolerates, can advance to soft diet this evening. Case discussed with Dr. Ewing Schlein. May start patient on Mesalamine during admission for treatment of Crohn's. Will need outpatient follow up for management of Crohn's disease following resolution of abscess. Continue daily CBC and transfuse as needed to maintain HGB > 7. [facility] will follow.  Berdine Dance PA-C 08/08/2021, 11:21 AM  Contact #  [phone]

## 2021-08-08 NOTE — Plan of Care (Signed)
?  Problem: Clinical Measurements: ?Goal: Will remain free from infection ?Outcome: Progressing ?  ?

## 2021-08-08 NOTE — Progress Notes (Signed)
Patient ID: Laura Hebert, female   DOB: 1975/05/10, 46 y.o.   MRN: [id number] Pt's imaging studies have been reviewed by Dr. Kennith Maes) this am for possible abd abscess drainage and d/w Dr. Allen([facility]). Will plan to monitor pt for now as abscess is deep and small . Cont conservative management with repeat imaging in few days to reassess.

## 2021-08-08 NOTE — Progress Notes (Signed)
Transition of Care [facility]) Screening Note  Patient Details  Name: Laura Hebert Date of Birth: Jun 28, 1975  Transition of Care [facility]) CM/SW Contact:    Ewing Schlein, LCSW Phone Number: 08/08/2021, 10:38 AM  Transition of Care Department [facility]) has reviewed patient and no TOC needs have been identified at this time. We will continue to monitor patient advancement through interdisciplinary progression rounds. If new patient transition needs arise, please place a TOC consult.

## 2021-08-08 NOTE — Progress Notes (Signed)
Progress Note     Subjective: Pt reports some abdominal pain this AM, mostly in LLQ. She denies nausea. Passing flatus, no BM.    Objective: Vital signs in last 24 hours: Temp:  [98.1 F (36.7 C)-98.4 F (36.9 C)] 98.1 F (36.7 C) (06/05 0830) Pulse Rate:  [81-88] 81 (06/05 0830) Resp:  [16-20] 18 (06/05 0830) BP: (113-130)/(66-84) 122/72 (06/05 0830) SpO2:  [95 %-97 %] 96 % (06/05 0830) Last BM Date : 08/06/21  Intake/Output from previous day: 06/04 0701 - 06/05 0700 In: 1751.2 [P.O.:240; I.V.:1384.2; IV Piggyback:127] Out: -  Intake/Output this shift: No intake/output data recorded.  PE: General: pleasant, WD, WN female who is laying in bed in NAD Heart: regular, rate, and rhythm.   Lungs: Respiratory effort nonlabored Abd: soft, mild ttp in LLQ without significant peritonitis, ND, +BS, no masses, hernias, or organomegaly MS: all 4 extremities are symmetrical with no cyanosis, clubbing, or edema. Skin: warm and dry with no masses, lesions, or rashes Neuro: Cranial nerves 2-12 grossly intact, sensation is normal throughout Psych: A&Ox3 with an appropriate affect.    Lab Results:  Recent Labs    08/07/21 0324 08/08/21 0325  WBC 7.0 4.8  HGB 8.1* 7.4*  HCT 27.6* 25.3*  PLT 326 292   BMET Recent Labs    08/06/21 1414 08/07/21 0324  NA 137 139  K 4.1 3.5  CL 104 107  CO2 25 26  GLUCOSE 95 90  BUN 14 10  CREATININE 0.81 0.90  CALCIUM 9.3 8.5*   PT/INR Recent Labs    08/08/21 0325  LABPROT 14.4  INR 1.1   CMP     Component Value Date/Time   NA 139 08/07/2021 0324   K 3.5 08/07/2021 0324   CL 107 08/07/2021 0324   CO2 26 08/07/2021 0324   GLUCOSE 90 08/07/2021 0324   BUN 10 08/07/2021 0324   CREATININE 0.90 08/07/2021 0324   CALCIUM 8.5 (L) 08/07/2021 0324   PROT 6.5 08/07/2021 0324   ALBUMIN 2.7 (L) 08/07/2021 0324   AST 7 (L) 08/07/2021 0324   ALT 7 08/07/2021 0324   ALKPHOS 77 08/07/2021 0324   BILITOT 0.4 08/07/2021 0324   GFRNONAA  >60 08/07/2021 0324   GFRAA >60 06/14/2017 1413   Lipase     Component Value Date/Time   LIPASE 27 08/06/2021 1414       Studies/Results: CT Abdomen Pelvis W Contrast  Result Date: 08/06/2021 CLINICAL DATA:  Abdominal pain and nausea and vomiting for 1 week. Crohn disease. EXAM: CT ABDOMEN AND PELVIS WITH CONTRAST TECHNIQUE: Multidetector CT imaging of the abdomen and pelvis was performed using the standard protocol following bolus administration of intravenous contrast. RADIATION DOSE REDUCTION: This exam was performed according to the departmental dose-optimization program which includes automated exposure control, adjustment of the mA and/or kV according to patient size and/or use of iterative reconstruction technique. CONTRAST:  OMNIPAQUE IOHEXOL 300 MG/ML  SOLN COMPARISON:  Noncontrast CT on 05/12/2020 FINDINGS: Lower Chest: No acute findings. Hepatobiliary: No hepatic masses identified. Cholelithiasis is noted, however there is no evidence of cholecystitis or biliary ductal dilatation. Pancreas:  No mass or inflammatory changes. Spleen: Within normal limits in size and appearance. Adrenals/Urinary Tract: No masses identified. No evidence of ureteral calculi or hydronephrosis. Stomach/Bowel: Changes of chronic colitis are again seen involving the left colon. Superimposed acute colitis is seen in the mid sigmoid colon, with a pericolonic abscess measuring 3.0 x 2.8 cm. No evidence of free intraperitoneal air.  Reactive thickening adjacent small bowel loop is seen in the left lower quadrant, however there is no evidence of bowel obstruction. No evidence of diverticular disease seen involving the colon. Terminal ileum is unremarkable in appearance. Vascular/Lymphatic: No pathologically enlarged lymph nodes. No acute vascular findings. Reproductive:  No mass or other significant abnormality. Other:  None. Musculoskeletal:  No suspicious bone lesions identified. IMPRESSION: Acute on chronic  colitis involving the mid sigmoid colon, with 3 cm pericolonic abscess. No evidence of diverticular disease or free intraperitoneal air. Reactive wall thickening of adjacent small bowel in left lower quadrant. No evidence of bowel obstruction. Cholelithiasis. No radiographic evidence of cholecystitis. Electronically Signed   By: Danae Orleans M.D.   On: 08/06/2021 16:46    Anti-infectives: Anti-infectives (From admission, onward)    Start     Dose/Rate Route Frequency Ordered Stop   08/07/21 0200  piperacillin-tazobactam (ZOSYN) IVPB 3.375 g        3.375 g 12.5 mL/hr over 240 Minutes Intravenous Every 8 hours 08/06/21 1958     08/06/21 1800  piperacillin-tazobactam (ZOSYN) IVPB 3.375 g  Status:  Discontinued        3.375 g 12.5 mL/hr over 240 Minutes Intravenous Every 6 hours 08/06/21 1723 08/06/21 1957        Assessment/Plan Crohn's colitis with 3 cm pericolonic abscess - CT 6/3 with above findings - IR did not feel collection was accessible at this time, recommend repeat CT in a few days if not improving with medical management of Crohn's and abscess - GI consulted and saw yesterday  - no leukocytosis, afebrile and no concern for obstruction at this time  - defer to GI on medical management of Crohn's and related abscess, would not recommend surgical intervention acutely  - diet per GI - mobilize as tolerated  FEN: CLD, IVF@75  cc/h VTE: LMWH ID: Zosyn 6/3>>  LOS: 2 days   I reviewed Consultant GI and IR notes, hospitalist notes, last 24 h vitals and pain scores, last 48 h intake and output, last 24 h labs and trends, and last 24 h imaging results.    Juliet Rude, [facility] 08/08/2021, 10:22 AM Please see Amion for pager number during day hours 7:00am-4:30pm

## 2021-08-09 ENCOUNTER — Inpatient Hospital Stay (HOSPITAL_COMMUNITY): Payer: Managed Care, Other (non HMO)

## 2021-08-09 DIAGNOSIS — N200 Calculus of kidney: Secondary | ICD-10-CM | POA: Insufficient documentation

## 2021-08-09 DIAGNOSIS — F319 Bipolar disorder, unspecified: Secondary | ICD-10-CM | POA: Diagnosis present

## 2021-08-09 DIAGNOSIS — K50114 Crohn's disease of large intestine with abscess: Secondary | ICD-10-CM

## 2021-08-09 LAB — BASIC METABOLIC PANEL
Anion gap: 6 (ref 5–15)
BUN: <5 mg/dL — ABNORMAL LOW (ref 6–20)
CO2: 25 mmol/L (ref 22–32)
Calcium: 8.2 mg/dL — ABNORMAL LOW (ref 8.9–10.3)
Chloride: 105 mmol/L (ref 98–111)
Creatinine, Ser: 0.9 mg/dL (ref 0.44–1.00)
GFR, Estimated: >60 mL/min (ref >60)
Glucose, Bld: 86 mg/dL (ref 70–99)
Potassium: 3.5 mmol/L (ref 3.5–5.1)
Sodium: 136 mmol/L (ref 135–145)

## 2021-08-09 LAB — IRON AND TIBC
Iron: 16 ug/dL — ABNORMAL LOW (ref 28–170)
Saturation Ratios: 7 % — ABNORMAL LOW (ref 10.4–31.8)
TIBC: 214 ug/dL — ABNORMAL LOW (ref 250–450)
UIBC: 198 ug/dL

## 2021-08-09 LAB — FERRITIN: Ferritin: 25 ng/mL (ref 11–307)

## 2021-08-09 LAB — CBC
HCT: 26 % — ABNORMAL LOW (ref 36.0–46.0)
Hemoglobin: 7.7 g/dL — ABNORMAL LOW (ref 12.0–15.0)
MCH: 23.5 pg — ABNORMAL LOW (ref 26.0–34.0)
MCHC: 29.6 g/dL — ABNORMAL LOW (ref 30.0–36.0)
MCV: 79.5 fL — ABNORMAL LOW (ref 80.0–100.0)
Platelets: 331 10*3/uL (ref 150–400)
RBC: 3.27 MIL/uL — ABNORMAL LOW (ref 3.87–5.11)
RDW: 14.6 % (ref 11.5–15.5)
WBC: 5.1 10*3/uL (ref 4.0–10.5)
nRBC: 0 % (ref 0.0–0.2)

## 2021-08-09 MED ORDER — LIP MEDEX EX OINT
1.0000 "application " | TOPICAL_OINTMENT | CUTANEOUS | Status: DC | PRN
  Filled 2021-08-09: qty 7

## 2021-08-09 MED ORDER — SODIUM CHLORIDE (PF) 0.9 % IJ SOLN
INTRAMUSCULAR | Status: AC
  Filled 2021-08-09: qty 50

## 2021-08-09 MED ORDER — IOHEXOL 300 MG/ML  SOLN
100.0000 mL | Freq: Once | INTRAMUSCULAR | Status: AC | PRN
  Administered 2021-08-09: 100 mL via INTRAVENOUS

## 2021-08-09 MED ORDER — DIPHENHYDRAMINE HCL 50 MG/ML IJ SOLN
12.5000 mg | Freq: Once | INTRAMUSCULAR | Status: AC
  Administered 2021-08-09: 12.5 mg via INTRAVENOUS
  Filled 2021-08-09: qty 1

## 2021-08-09 MED ORDER — DIPHENHYDRAMINE HCL 25 MG PO CAPS
25.0000 mg | ORAL_CAPSULE | Freq: Four times a day (QID) | ORAL | Status: DC | PRN
Start: 2021-08-09 — End: 2021-08-10
  Administered 2021-08-09: 25 mg via ORAL
  Filled 2021-08-09: qty 1

## 2021-08-09 MED ORDER — IOHEXOL 9 MG/ML PO SOLN
ORAL | Status: AC
  Administered 2021-08-09: 500 mL
  Filled 2021-08-09: qty 1000

## 2021-08-09 MED ORDER — IOHEXOL 9 MG/ML PO SOLN
500.0000 mL | ORAL | Status: AC
  Administered 2021-08-09: 500 mL via ORAL

## 2021-08-09 NOTE — Progress Notes (Signed)
Progress Note     Subjective: Pt reports abdominal pain improving today. Tolerating diet and had a BM yesterday. Husband at bedside this AM  Objective: Vital signs in last 24 hours: Temp:  [97.6 F (36.4 C)-99 F (37.2 C)] 98 F (36.7 C) (06/06 0621) Pulse Rate:  [80-90] 80 (06/06 0621) Resp:  [16-18] 16 (06/06 0621) BP: (105-132)/(57-79) 105/57 (06/06 0621) SpO2:  [97 %-100 %] 97 % (06/06 0621) Last BM Date : 08/08/21  Intake/Output from previous day: 06/05 0701 - 06/06 0700 In: 1147.7 [P.O.:880; I.V.:187.6; IV Piggyback:80.2] Out: -  Intake/Output this shift: No intake/output data recorded.  PE: General: pleasant, WD, WN female who is laying in bed in NAD Lungs: Respiratory effort nonlabored Abd: soft, mild ttp in LLQ without significant peritonitis, ND Psych: A&Ox3 with an appropriate affect.    Lab Results:  Recent Labs    08/08/21 0325 08/09/21 0506  WBC 4.8 5.1  HGB 7.4* 7.7*  HCT 25.3* 26.0*  PLT 292 331    BMET Recent Labs    08/07/21 0324 08/09/21 0506  NA 139 136  K 3.5 3.5  CL 107 105  CO2 26 25  GLUCOSE 90 86  BUN 10 <5*  CREATININE 0.90 0.90  CALCIUM 8.5* 8.2*    PT/INR Recent Labs    08/08/21 0325  LABPROT 14.4  INR 1.1    CMP     Component Value Date/Time   NA 136 08/09/2021 0506   K 3.5 08/09/2021 0506   CL 105 08/09/2021 0506   CO2 25 08/09/2021 0506   GLUCOSE 86 08/09/2021 0506   BUN <5 (L) 08/09/2021 0506   CREATININE 0.90 08/09/2021 0506   CALCIUM 8.2 (L) 08/09/2021 0506   PROT 6.5 08/07/2021 0324   ALBUMIN 2.7 (L) 08/07/2021 0324   AST 7 (L) 08/07/2021 0324   ALT 7 08/07/2021 0324   ALKPHOS 77 08/07/2021 0324   BILITOT 0.4 08/07/2021 0324   GFRNONAA >60 08/09/2021 0506   GFRAA >60 06/14/2017 1413   Lipase     Component Value Date/Time   LIPASE 27 08/06/2021 1414       Studies/Results: No results found.  Anti-infectives: Anti-infectives (From admission, onward)    Start     Dose/Rate Route  Frequency Ordered Stop   08/07/21 0200  piperacillin-tazobactam (ZOSYN) IVPB 3.375 g        3.375 g 12.5 mL/hr over 240 Minutes Intravenous Every 8 hours 08/06/21 1958     08/06/21 1800  piperacillin-tazobactam (ZOSYN) IVPB 3.375 g  Status:  Discontinued        3.375 g 12.5 mL/hr over 240 Minutes Intravenous Every 6 hours 08/06/21 1723 08/06/21 1957        Assessment/Plan Crohn's colitis with 3 cm pericolonic abscess - CT 6/3 with above findings - IR did not feel collection was accessible at this time, recommend repeat CT in a few days if not improving with medical management of Crohn's and abscess - GI following and started mesalamine  - no leukocytosis, afebrile and no concern for obstruction at this time  - defer to GI on medical management of Crohn's and related abscess, would not recommend surgical intervention acutely  - diet per GI - mobilize as tolerated - repeat CT planned today  FEN: soft diet, IVF@75  cc/h VTE: LMWH ID: Zosyn 6/3>>  LOS: 3 days   I reviewed Consultant GI notes, hospitalist notes, last 24 h vitals and pain scores, last 48 h intake and output, and last 24  h labs and trends.    Juliet Rude, [facility] 08/09/2021, 10:48 AM Please see Amion for pager number during day hours 7:00am-4:30pm

## 2021-08-09 NOTE — Progress Notes (Signed)
[facility] Progress Note  Laura Hebert 46 y.o. [birth year]  CC: Intra-abdominal abscess   Subjective: Patient states she is feeling better today, husband at bedside.  She was able to tolerate full liquids at lunch yesterday but did not have anything else to eat in the evening.  No appetite this morning.  States pain has improved.  She had small amount of loose stool last night, denies melena or hematochezia.  Denies nausea, vomiting, fever, chills, dysuria.  ROS : Review of Systems  Constitutional:  Negative for chills and fever.  Gastrointestinal:  Positive for abdominal pain (improving). Negative for blood in stool, constipation, diarrhea, heartburn, melena, nausea and vomiting.  Genitourinary:  Negative for dysuria.  Musculoskeletal:  Negative for falls.     Objective: Vital signs in last 24 hours: Vitals:   08/08/21 2120 08/09/21 0621  BP: 129/67 (!) 105/57  Pulse: 89 80  Resp: 18 16  Temp: 99 F (37.2 C) 98 F (36.7 C)  SpO2: 97% 97%    Physical Exam:  General:  Alert, cooperative, no distress, appears stated age  Head:  Normocephalic, without obvious abnormality, atraumatic  Eyes:  Anicteric sclera, EOM's intact  Lungs:   Clear to auscultation bilaterally, respirations unlabored  Heart:  Regular rate and rhythm, S1, S2 normal  Abdomen:   Soft, non-tender, bowel sounds active all four quadrants,  no masses,   Extremities: Extremities normal, atraumatic, no  edema  Pulses: 2+ and symmetric    Lab Results: Recent Labs    08/07/21 0324 08/09/21 0506  NA 139 136  K 3.5 3.5  CL 107 105  CO2 26 25  GLUCOSE 90 86  BUN 10 <5*  CREATININE 0.90 0.90  CALCIUM 8.5* 8.2*   Recent Labs    08/06/21 1414 08/07/21 0324  AST 10* 7*  ALT 8 7  ALKPHOS 104 77  BILITOT 0.3 0.4  PROT 8.4* 6.5  ALBUMIN 3.6 2.7*   Recent Labs    08/06/21 1414 08/07/21 0324 08/08/21 0325 08/09/21 0506  WBC 8.4   < > 4.8 5.1  NEUTROABS 7.6  --   --   --   HGB 10.5*    < > 7.4* 7.7*  HCT 33.9*   < > 25.3* 26.0*  MCV 77.6*   < > 81.4 79.5*  PLT 464*   < > 292 331   < > = values in this interval not displayed.   Recent Labs    08/08/21 0325  LABPROT 14.4  INR 1.1    Assessment 3 x 2.8 cm pericolonic abscess with left-sided colitis   History of Crohn's disease, diagnosed at age 70, previously on Asacol and Remicade, without any medication for Crohn's for at least 9 years.   Normocytic anemia, hemoglobin 7.7 (7.4), platelet 331, MCV 79.5 Low albumin 2.7  Iron 16, ferritin 25   Plan: IR monitoring abscess at this time which is deep and small. Opting for conservative management with repeat imaging in a few days to reassess. Continue supportive care.  Keep on full liquids for now. If patient tolerates and has appetite later today can advance to soft diet. Continue Mesalamine during admission for treatment of Crohn's. Will need outpatient follow up for management of Crohn's disease following resolution of abscess. Continue daily CBC and transfuse as needed to maintain HGB > 7. Consider starting patient on iron supplementation. [facility] will follow.  Berdine Dance PA-C 08/09/2021, 9:26 AM  Contact #  [phone]

## 2021-08-09 NOTE — Progress Notes (Addendum)
PROGRESS NOTE    Laura Hebert  IEP:[id number] DOB: 03/29/1975 DOA: 08/06/2021 PCP: Jamal Collin, PA-C       Brief Narrative:  Patient is a 45 year old female with history of chronic disease, and nephrolithiasis, depression and bipolar disorder.  Patient has been admitted with mid sigmoid colitis with 3 cm pericolonic abscess.  Patient is currently on IV Zosyn.  Abscess too small and deep to drain- plan to repeat imagine in AM but somehow patient got contrast on 6/6 so date changed.  Patient will also need GI follow up outpatient.    Assessment & Plan:   Principal Problem:   Crohn's colitis, with abscess (HCC) Active Problems:   Intra-abdominal abscess (HCC)   Crohn's disease (HCC)   Depression   Anxiety state   Bipolar disorder (HCC)   Acute on chronic colitis 3 mm pericolonic abscess -In the background of history of Crohn's disease, reportedly in remission for> 10 years -Continue IV Zosyn -advance diet -GI is following patient. -Interventional radiology team to follow with repeat imaging (ordered for 6/7 but contrast given 6/6 so date changed) -Surgical input is appreciated.   Cholelithiasis -Asymptomatic.   Chronic anemia -patient started period on 6/4 so suspected drop in hgb -PO iron outpatient can consider IV Fe once infection treated    Depression, bipolar disorder -Resume home regimen of carbamazepine and Lamictal   DVT prophylaxis: ambulate Code Status: Full code Family Communication: at [place] Disposition Plan: Home tom PM if CT scan improved   Consultants:  GI Interventional radiology GS     Subjective: Pain improved, tolerating diet  Objective: Vitals:   08/08/21 0830 08/08/21 1335 08/08/21 2120 08/09/21 0621  BP: 122/72 132/79 129/67 (!) 105/57  Pulse: 81 90 89 80  Resp: 18 16 18 16   Temp: 98.1 F (36.7 C) 97.6 F (36.4 C) 99 F (37.2 C) 98 F (36.7 C)  TempSrc: Oral Oral Oral Oral  SpO2: 96% 100% 97% 97%  Weight:       Height:        Intake/Output Summary (Last 24 hours) at 08/09/2021 1017 Last data filed at 08/08/2021 1800 Gross per 24 hour  Intake 665.5 ml  Output --  Net 665.5 ml   Filed Weights   08/06/21 1405  Weight: 70.3 kg    Examination:   General: Appearance:     Overweight female in no acute distress     Lungs:     respirations unlabored  Heart:    Normal heart rate. Normal rhythm. No murmurs, rubs, or gallops.   MS:   All extremities are intact.   Neurologic:   Awake, alert       Data Reviewed: I have personally reviewed following labs and imaging studies  CBC: Recent Labs  Lab 08/06/21 1414 08/07/21 0324 08/08/21 0325 08/09/21 0506  WBC 8.4 7.0 4.8 5.1  NEUTROABS 7.6  --   --   --   HGB 10.5* 8.1* 7.4* 7.7*  HCT 33.9* 27.6* 25.3* 26.0*  MCV 77.6* 81.4 81.4 79.5*  PLT 464* 326 292 331   Basic Metabolic Panel: Recent Labs  Lab 08/06/21 1414 08/07/21 0324 08/09/21 0506  NA 137 139 136  K 4.1 3.5 3.5  CL 104 107 105  CO2 25 26 25   GLUCOSE 95 90 86  BUN 14 10 <5*  CREATININE 0.81 0.90 0.90  CALCIUM 9.3 8.5* 8.2*   GFR: Estimated Creatinine Clearance: 72.3 mL/min (by C-G formula based on SCr of 0.9 mg/dL). Liver Function Tests: Recent  Labs  Lab 08/06/21 1414 08/07/21 0324  AST 10* 7*  ALT 8 7  ALKPHOS 104 77  BILITOT 0.3 0.4  PROT 8.4* 6.5  ALBUMIN 3.6 2.7*   Recent Labs  Lab 08/06/21 1414  LIPASE 27   No results for input(s): AMMONIA in the last 168 hours. Coagulation Profile: Recent Labs  Lab 08/08/21 0325  INR 1.1   Cardiac Enzymes: No results for input(s): CKTOTAL, CKMB, CKMBINDEX, TROPONINI in the last 168 hours. BNP (last 3 results) No results for input(s): PROBNP in the last 8760 hours. HbA1C: No results for input(s): HGBA1C in the last 72 hours. CBG: Recent Labs  Lab 08/08/21 1617  GLUCAP 93   Lipid Profile: No results for input(s): CHOL, HDL, LDLCALC, TRIG, CHOLHDL, LDLDIRECT in the last 72 hours. Thyroid Function  Tests: No results for input(s): TSH, T4TOTAL, FREET4, T3FREE, THYROIDAB in the last 72 hours. Anemia Panel: Recent Labs    08/09/21 0506  FERRITIN 25  TIBC 214*  IRON 16*   Urine analysis:    Component Value Date/Time   COLORURINE YELLOW 08/06/2021 1414   APPEARANCEUR HAZY (A) 08/06/2021 1414   LABSPEC 1.020 08/06/2021 1414   PHURINE 7.0 08/06/2021 1414   GLUCOSEU NEGATIVE 08/06/2021 1414   HGBUR NEGATIVE 08/06/2021 1414   BILIRUBINUR SMALL (A) 08/06/2021 1414   KETONESUR NEGATIVE 08/06/2021 1414   PROTEINUR 30 (A) 08/06/2021 1414   NITRITE NEGATIVE 08/06/2021 1414   LEUKOCYTESUR NEGATIVE 08/06/2021 1414      Recent Results (from the past 240 hour(s))  Urine Culture     Status: None   Collection Time: 08/07/21  4:06 PM   Specimen: Urine, Clean Catch  Result Value Ref Range Status   Specimen Description   Final    URINE, CLEAN CATCH Performed at [facility], [address], [place], [place] [address]    Special Requests   Final    NONE Performed at [facility], [address] [place], [place] [address]    Culture   Final    NO GROWTH Performed at [facility], [address], [place], [place] [address]    Report Status 08/08/2021 FINAL  Final         Radiology Studies: No results found.      Scheduled Meds:  carbamazepine  400 mg Oral QHS   iohexol  500 mL Oral Q1H   lamoTRIgine  200 mg Oral QHS   mesalamine  4.8 g Oral Q breakfast   Continuous Infusions:  sodium chloride 10 mL/hr at 08/06/21 1749   sodium chloride 75 mL/hr at 08/08/21 2349   piperacillin-tazobactam (ZOSYN)  IV 3.375 g (08/09/21 1008)     LOS: 3 days    Marlin Canary DO [facility] 7PM-7AM contact night coverage as above

## 2021-08-09 NOTE — Progress Notes (Signed)
24 hour chart audit completed 

## 2021-08-10 DIAGNOSIS — K50114 Crohn's disease of large intestine with abscess: Secondary | ICD-10-CM | POA: Diagnosis not present

## 2021-08-10 LAB — CBC
HCT: 26.5 % — ABNORMAL LOW (ref 36.0–46.0)
Hemoglobin: 8.1 g/dL — ABNORMAL LOW (ref 12.0–15.0)
MCH: 24.2 pg — ABNORMAL LOW (ref 26.0–34.0)
MCHC: 30.6 g/dL (ref 30.0–36.0)
MCV: 79.1 fL — ABNORMAL LOW (ref 80.0–100.0)
Platelets: 351 10*3/uL (ref 150–400)
RBC: 3.35 MIL/uL — ABNORMAL LOW (ref 3.87–5.11)
RDW: 14.5 % (ref 11.5–15.5)
WBC: 6.3 10*3/uL (ref 4.0–10.5)
nRBC: 0 % (ref 0.0–0.2)

## 2021-08-10 MED ORDER — AMOXICILLIN-POT CLAVULANATE 875-125 MG PO TABS: 1.0000 | ORAL_TABLET | Freq: Two times a day (BID) | ORAL | 0 refills | Status: AC

## 2021-08-10 MED ORDER — TRAMADOL HCL 50 MG PO TABS: 50.0000 mg | ORAL_TABLET | Freq: Four times a day (QID) | ORAL | 0 refills | Status: AC | PRN

## 2021-08-10 MED ORDER — MESALAMINE 1.2 G PO TBEC
4.8000 g | DELAYED_RELEASE_TABLET | Freq: Every day | ORAL | 0 refills | Status: AC
Start: 2021-08-11 — End: ?

## 2021-08-10 NOTE — Discharge Summary (Signed)
Discharge Summary  Laura Hebert [id number] DOB: 1976/02/06  PCP: Ardith Dark, PA-C  Admit date: 08/06/2021 Discharge date: 08/10/2021  Time spent:  59mins  Recommendations for Outpatient Follow-up:  F/u with PCP within a week  for hospital discharge follow up, repeat cbc/bmp at follow up F/u with GI, GI to schedule repeat CT ab/pel   Discharge Diagnoses:  Active Hospital Problems   Diagnosis Date Noted   Crohn's colitis, with abscess ([place]) 08/06/2021   Bipolar disorder ([place]) 08/09/2021   Intra-abdominal abscess ([place]) 08/06/2021   Crohn's disease ([place]) 08/06/2021   Depression 08/06/2021   Anxiety state 08/06/2013    Resolved Hospital Problems  No resolved problems to display.    Discharge Condition: stable  Diet recommendation: soft diet, advance diet as tolerated   Filed Weights   08/06/21 1405  Weight: 70.3 kg    History of present illness: ( per admitting MD Dr Broadus John) Laura Hebert is a 45/F with history of Crohn's disease, depression, bipolar disorder, kidney stones, presented to the ED with abdominal pain, nausea, fevers and chills, symptoms started approximately a week ago. -Remote history of Crohn's disease, diagnosed in [place] -previously treated with Asacol and Remicade, has been in remission for over 10 years, has not been on any therapy during this time. -She has been feeling hot and cold for the past week, sleeping a lot the last few days, had abdominal discomfort and pain intermittently during this time, pain got much more intense over the last 24 hours, presented to [facility]   ED Course: Mildly tachycardic, afebrile, WBC and electrolytes normal, platelets mildly elevated, mildly anemic, CT abdomen pelvis noted acute on chronic colitis involving mid sigmoid colon with a 3 cm pericolonic abscess. -She was given IV fluids and a dose of Zosyn, [facility] as well as general surgery were consulted  Hospital  Course:  Principal Problem:   Crohn's colitis, with abscess ([place]) Active Problems:   Intra-abdominal abscess ([place])   Crohn's disease ([place])   Depression   Anxiety state   Bipolar disorder ([place])   Left side colitis with 3x2.8 cm pericolonic abscess -In the background of history of Crohn's disease, reportedly in remission for> 10 years -seen by GI/gen surgery and IR, she clinically improved with bowel rest,  IV Zosyn, repeat ct on 6/6 did not show significant interval changes, IR do not feel collection was accessible at this time -both GI and gen surg recommend discharge home with oral abx and repeat ct on outpatient basis in 5-7 days, patient desires to go home, she is aware to call GI or return to the hospital if ab pain got worse or she start to have fever -d/c on mesalamine per gi recommendation    Cholelithiasis -Asymptomatic.   Chronic anemia. Microcytic, iron deficiency -patient does not want to take oral iron or get IV iron currently, she prefers to f/u with GI to discuss iron supplement    Depression, bipolar disorder -Resume home regimen of carbamazepine and Lamictal   Discharge Exam: BP 123/76 (BP Location: Left Arm)   Pulse 87   Temp 98.9 F (37.2 C) (Oral)   Resp 18   Ht 5\' 1"  (1.549 m)   Wt 70.3 kg   LMP 08/09/2021   SpO2 95%   BMI 29.29 kg/m   General: NAD Cardiovascular: RRR Respiratory: normal respiratory effort     Discharge Instructions     Diet general   Complete by: As directed    Soft diet, advance  as tolerated   Increase activity slowly   Complete by: As directed       Allergies as of 08/10/2021       Reactions   Latex Rash   Nsaids Other (See Comments)   History of Crohn's = NO NSIADs   Tape Other (See Comments)   Tape leaves redness sometimes   Nylon Rash   Penicillins Rash        Medication List     TAKE these medications    amoxicillin-clavulanate 875-125 MG tablet Commonly known as: AUGMENTIN Take 1 tablet by mouth 2  (two) times daily for 9 days.   carbamazepine 200 MG 12 hr tablet Commonly known as: TEGRETOL XR Take 200-400 mg by mouth See admin instructions. Take 400 mg by mouth at bedtime and an additional dose of 200 mg as directed for sleeplessness of several to 5 consecutive nights   lamoTRIgine 200 MG tablet Commonly known as: LAMICTAL Take 200 mg by mouth at bedtime.   mesalamine 1.2 g EC tablet Commonly known as: LIALDA Take 4 tablets (4.8 g total) by mouth daily with breakfast. Start taking on: August 11, 2021   traMADol 50 MG tablet Commonly known as: Ultram Take 1 tablet (50 mg total) by mouth every 6 (six) hours as needed for up to 7 days.   TYLENOL 500 MG tablet Generic drug: acetaminophen Take 500-1,000 mg by mouth every 6 (six) hours as needed for mild pain or headache.       Allergies  Allergen Reactions   Latex Rash   Nsaids Other (See Comments)    History of Crohn's = NO NSIADs   Tape Other (See Comments)    Tape leaves redness sometimes   Nylon Rash   Penicillins Rash    Follow-up Information     [facility], [place]. Call.   Specialty: General Surgery Why: As needed Contact information: [address] [place] [address] [phone]         Ronnette Juniper, MD Follow up.   Specialty: Gastroenterology Why: hospital discharge follow up, repeat basic lab works including cbc/bmp at follow up Contact information: [address] [place] [address] [phone]         Ardith Dark, PA-C Follow up.   Specialty: Physician Assistant Contact information: [id number] [place] [address] [phone]                  The results of significant diagnostics from this hospitalization (including imaging, microbiology, ancillary and laboratory) are listed below for reference.    Significant Diagnostic Studies: CT ABDOMEN PELVIS W CONTRAST  Result Date: 08/09/2021 CLINICAL DATA:  Crohn's disease  with paracolic abscess EXAM: CT ABDOMEN AND PELVIS WITH CONTRAST TECHNIQUE: Multidetector CT imaging of the abdomen and pelvis was performed using the standard protocol following bolus administration of intravenous contrast. RADIATION DOSE REDUCTION: This exam was performed according to the departmental dose-optimization program which includes automated exposure control, adjustment of the mA and/or kV according to patient size and/or use of iterative reconstruction technique. CONTRAST:  129mL OMNIPAQUE IOHEXOL 300 MG/ML  SOLN COMPARISON:  08/06/2021 FINDINGS: Lower chest: Trace right pleural effusion. Hepatobiliary: 2.4 cm gallstone in the gallbladder. No biliary dilatation observed. Pancreas: Unremarkable Spleen: The spleen measures 12.4 by 5.3 by 11.3 cm (volume = 390 cm^3), upper normal size. No focal splenic abnormality. Adrenals/Urinary Tract: Unremarkable Stomach/Bowel: Interposed between the substantially thickened wall of the sigmoid colon and the substantially  thickened adjacent loop of small bowel, a 2.9 by 2.3 by 2.8 cm (volume = 9.8 cm^3) collection of gas and fluid is present compatible with abscess. By my measurement this was previously 2.5 by 2.8 by 2.1 cm (volume = 7.7 cm^3). There is additional localized extraluminal gas and fluid extending from the sigmoid colon towards this abscess. The adjacent distal ileal loop demonstrates circumferential wall thickening and some polypoid filling defects which could be inflammatory. The sigmoid colon demonstrates diffuse circumferential wall thickening. Definite continuity of the small bowel with the abscess is not demonstrated, and I would tend to favor that the small bowel is either secondarily inflamed (most likely) or a skip lesion but that the sigmoid colon is likely the origin of the abscess. Scattered air-levels in the rest of the mildly distended colon. There is fat in the wall of the rectum which could be from chronic inflammation. No overtly dilated  small bowel. Vascular/Lymphatic: Unremarkable Reproductive: Questionable trace fluid adjacent to the right ovary. Other: No supplemental non-categorized findings. Musculoskeletal: Schmorl's nodes at the L2-3 endplates. IMPRESSION: 1. Roughly similar size of the left lower quadrant abscess interposed between a thickened loop of distal ileum and the thickened sigmoid colon. The abscess appears most closely associated with the sigmoid colon loop, with small additional extraluminal locules of gas and fluid tracking between the sigmoid colon and this abscess. All of the extraluminal gas appears locally contained, with no true free intraperitoneal gas loose in the abdomen. The involvement of the distal ileal loop could be secondary inflammation due to the adjacent abscess, or a skip lesion. 2. There also scattered air-fluid levels in the more proximal colon mild prominence of the caliber of the more proximal colon, although no wall thickening in the proximal colon. 3. Potential trace free fluid adjacent to the right ovary. 4. Trace right pleural effusion. 5. Cholelithiasis. Electronically Signed   By: Van Clines M.D.   On: 08/09/2021 13:43   CT Abdomen Pelvis W Contrast  Result Date: 08/06/2021 CLINICAL DATA:  Abdominal pain and nausea and vomiting for 1 week. Crohn disease. EXAM: CT ABDOMEN AND PELVIS WITH CONTRAST TECHNIQUE: Multidetector CT imaging of the abdomen and pelvis was performed using the standard protocol following bolus administration of intravenous contrast. RADIATION DOSE REDUCTION: This exam was performed according to the departmental dose-optimization program which includes automated exposure control, adjustment of the mA and/or kV according to patient size and/or use of iterative reconstruction technique. CONTRAST:  142mL OMNIPAQUE IOHEXOL 300 MG/ML  SOLN COMPARISON:  Noncontrast CT on 05/12/2020 FINDINGS: Lower Chest: No acute findings. Hepatobiliary: No hepatic masses identified.  Cholelithiasis is noted, however there is no evidence of cholecystitis or biliary ductal dilatation. Pancreas:  No mass or inflammatory changes. Spleen: Within normal limits in size and appearance. Adrenals/Urinary Tract: No masses identified. No evidence of ureteral calculi or hydronephrosis. Stomach/Bowel: Changes of chronic colitis are again seen involving the left colon. Superimposed acute colitis is seen in the mid sigmoid colon, with a pericolonic abscess measuring 3.0 x 2.8 cm. No evidence of free intraperitoneal air. Reactive thickening adjacent small bowel loop is seen in the left lower quadrant, however there is no evidence of bowel obstruction. No evidence of diverticular disease seen involving the colon. Terminal ileum is unremarkable in appearance. Vascular/Lymphatic: No pathologically enlarged lymph nodes. No acute vascular findings. Reproductive:  No mass or other significant abnormality. Other:  None. Musculoskeletal:  No suspicious bone lesions identified. IMPRESSION: Acute on chronic colitis involving the mid sigmoid colon, with  3 cm pericolonic abscess. No evidence of diverticular disease or free intraperitoneal air. Reactive wall thickening of adjacent small bowel in left lower quadrant. No evidence of bowel obstruction. Cholelithiasis. No radiographic evidence of cholecystitis. Electronically Signed   By: Marlaine Hind M.D.   On: 08/06/2021 16:46    Microbiology: Recent Results (from the past 240 hour(s))  Urine Culture     Status: None   Collection Time: 08/07/21  4:06 PM   Specimen: Urine, Clean Catch  Result Value Ref Range Status   Specimen Description   Final    URINE, CLEAN CATCH Performed at [facility], [place] [address] [place], [place] [address]    Special Requests   Final    NONE Performed at [facility], [place] [address] [place], [place] [address]    Culture   Final    NO GROWTH Performed at [facility], [place] [address] [place], [place] [address]    Report Status 08/08/2021 FINAL  Final     Labs: Basic Metabolic Panel: Recent Labs  Lab 08/06/21 1414 08/07/21 0324 08/09/21 0506  NA 137 139 136  K 4.1 3.5 3.5  CL 104 107 105  CO2 25 26 25   GLUCOSE 95 90 86  BUN 14 10 <5*  CREATININE 0.81 0.90 0.90  CALCIUM 9.3 8.5* 8.2*   Liver Function Tests: Recent Labs  Lab 08/06/21 1414 08/07/21 0324  AST 10* 7*  ALT 8 7  ALKPHOS 104 77  BILITOT 0.3 0.4  PROT 8.4* 6.5  ALBUMIN 3.6 2.7*   Recent Labs  Lab 08/06/21 1414  LIPASE 27   No results for input(s): AMMONIA in the last 168 hours. CBC: Recent Labs  Lab 08/06/21 1414 08/07/21 0324 08/08/21 0325 08/09/21 0506 08/10/21 0413  WBC 8.4 7.0 4.8 5.1 6.3  NEUTROABS 7.6  --   --   --   --   HGB 10.5* 8.1* 7.4* 7.7* 8.1*  HCT 33.9* 27.6* 25.3* 26.0* 26.5*  MCV 77.6* 81.4 81.4 79.5* 79.1*  PLT 464* 326 292 331 351   Cardiac Enzymes: No results for input(s): CKTOTAL, CKMB, CKMBINDEX, TROPONINI in the last 168 hours. BNP: BNP (last 3 results) No results for input(s): BNP in the last 8760 hours.  ProBNP (last 3 results) No results for input(s): PROBNP in the last 8760 hours.  CBG: Recent Labs  Lab 08/08/21 1617  GLUCAP 93    FURTHER DISCHARGE INSTRUCTIONS:   Get Medicines reviewed and adjusted: Please take all your medications with you for your next visit with your Primary MD   Laboratory/radiological data: Please request your Primary MD to go over all hospital tests and procedure/radiological results at the follow up, please ask your Primary MD to get all Hospital records sent to his/her office.   In some cases, they will be blood work, cultures and biopsy results pending at the time of your discharge. Please request that your primary care M.D. goes through all the records of your hospital data and follows up on these results.   Also Note the following: If you experience worsening of your admission symptoms, develop shortness  of breath, life threatening emergency, suicidal or homicidal thoughts you must seek medical attention immediately by calling 911 or calling your MD immediately  if symptoms less severe.   You must read complete instructions/literature along with all the possible adverse reactions/side effects for all the Medicines you take and that have been prescribed to you. Take any new Medicines  after you have completely understood and accpet all the possible adverse reactions/side effects.    Do not drive when taking Pain medications or sleeping medications (Benzodaizepines)   Do not take more than prescribed Pain, Sleep and Anxiety Medications. It is not advisable to combine anxiety,sleep and pain medications without talking with your primary care practitioner   Special Instructions: If you have smoked or chewed Tobacco  in the last 2 yrs please stop smoking, stop any regular Alcohol  and or any Recreational drug use.   Wear Seat belts while driving.   Please note: You were cared for by a hospitalist during your hospital stay. Once you are discharged, your primary care physician will handle any further medical issues. Please note that NO REFILLS for any discharge medications will be authorized once you are discharged, as it is imperative that you return to your primary care physician (or establish a relationship with a primary care physician if you do not have one) for your post hospital discharge needs so that they can reassess your need for medications and monitor your lab values.     Signed:  Florencia Reasons MD, PhD, FACP  [facility] 08/10/2021, 12:20 PM

## 2021-08-10 NOTE — Progress Notes (Signed)
Progress Note     Subjective: Pt reports abdominal pain improving today. Tolerating diet and having bowel function. Still having some intermittent gas pains after CT contrast yesterday but no nausea or vomiting. Afebrile. Hopeful to get home today. Husband at bedside this AM.   Objective: Vital signs in last 24 hours: Temp:  [97.6 F (36.4 C)-98.9 F (37.2 C)] 98.9 F (37.2 C) (06/07 0600) Pulse Rate:  [79-87] 87 (06/07 0600) Resp:  [16-18] 18 (06/07 0600) BP: (123-128)/(74-76) 123/76 (06/07 0600) SpO2:  [95 %-98 %] 95 % (06/07 0600) Last BM Date : 08/08/21  Intake/Output from previous day: 06/06 0701 - 06/07 0700 In: 2850.5 [P.O.:1000; I.V.:1631.3; IV Piggyback:219.3] Out: 400 [Urine:400] Intake/Output this shift: No intake/output data recorded.  PE: General: pleasant, WD, WN female who is laying in bed in NAD Lungs: Respiratory effort nonlabored Abd: soft, mild ttp in LLQ without significant peritonitis, ND Psych: A&Ox3 with an appropriate affect.    Lab Results:  Recent Labs    08/09/21 0506 08/10/21 0413  WBC 5.1 6.3  HGB 7.7* 8.1*  HCT 26.0* 26.5*  PLT 331 351    BMET Recent Labs    08/09/21 0506  NA 136  K 3.5  CL 105  CO2 25  GLUCOSE 86  BUN <5*  CREATININE 0.90  CALCIUM 8.2*    PT/INR Recent Labs    08/08/21 0325  LABPROT 14.4  INR 1.1    CMP     Component Value Date/Time   NA 136 08/09/2021 0506   K 3.5 08/09/2021 0506   CL 105 08/09/2021 0506   CO2 25 08/09/2021 0506   GLUCOSE 86 08/09/2021 0506   BUN <5 (L) 08/09/2021 0506   CREATININE 0.90 08/09/2021 0506   CALCIUM 8.2 (L) 08/09/2021 0506   PROT 6.5 08/07/2021 0324   ALBUMIN 2.7 (L) 08/07/2021 0324   AST 7 (L) 08/07/2021 0324   ALT 7 08/07/2021 0324   ALKPHOS 77 08/07/2021 0324   BILITOT 0.4 08/07/2021 0324   GFRNONAA >60 08/09/2021 0506   GFRAA >60 06/14/2017 1413   Lipase     Component Value Date/Time   LIPASE 27 08/06/2021 1414       Studies/Results: CT  ABDOMEN PELVIS W CONTRAST  Result Date: 08/09/2021 CLINICAL DATA:  Crohn's disease with paracolic abscess EXAM: CT ABDOMEN AND PELVIS WITH CONTRAST TECHNIQUE: Multidetector CT imaging of the abdomen and pelvis was performed using the standard protocol following bolus administration of intravenous contrast. RADIATION DOSE REDUCTION: This exam was performed according to the departmental dose-optimization program which includes automated exposure control, adjustment of the mA and/or kV according to patient size and/or use of iterative reconstruction technique. CONTRAST:  OMNIPAQUE IOHEXOL 300 MG/ML  SOLN COMPARISON:  08/06/2021 FINDINGS: Lower chest: Trace right pleural effusion. Hepatobiliary: 2.4 cm gallstone in the gallbladder. No biliary dilatation observed. Pancreas: Unremarkable Spleen: The spleen measures 12.4 by 5.3 by 11.3 cm (volume = 390 cm^3), upper normal size. No focal splenic abnormality. Adrenals/Urinary Tract: Unremarkable Stomach/Bowel: Interposed between the substantially thickened wall of the sigmoid colon and the substantially thickened adjacent loop of small bowel, a 2.9 by 2.3 by 2.8 cm (volume = 9.8 cm^3) collection of gas and fluid is present compatible with abscess. By my measurement this was previously 2.5 by 2.8 by 2.1 cm (volume = 7.7 cm^3). There is additional localized extraluminal gas and fluid extending from the sigmoid colon towards this abscess. The adjacent distal ileal loop demonstrates circumferential wall thickening and some polypoid  filling defects which could be inflammatory. The sigmoid colon demonstrates diffuse circumferential wall thickening. Definite continuity of the small bowel with the abscess is not demonstrated, and I would tend to favor that the small bowel is either secondarily inflamed (most likely) or a skip lesion but that the sigmoid colon is likely the origin of the abscess. Scattered air-levels in the rest of the mildly distended colon. There is fat in  the wall of the rectum which could be from chronic inflammation. No overtly dilated small bowel. Vascular/Lymphatic: Unremarkable Reproductive: Questionable trace fluid adjacent to the right ovary. Other: No supplemental non-categorized findings. Musculoskeletal: Schmorl's nodes at the L2-3 endplates. IMPRESSION: 1. Roughly similar size of the left lower quadrant abscess interposed between a thickened loop of distal ileum and the thickened sigmoid colon. The abscess appears most closely associated with the sigmoid colon loop, with small additional extraluminal locules of gas and fluid tracking between the sigmoid colon and this abscess. All of the extraluminal gas appears locally contained, with no true free intraperitoneal gas loose in the abdomen. The involvement of the distal ileal loop could be secondary inflammation due to the adjacent abscess, or a skip lesion. 2. There also scattered air-fluid levels in the more proximal colon mild prominence of the caliber of the more proximal colon, although no wall thickening in the proximal colon. 3. Potential trace free fluid adjacent to the right ovary. 4. Trace right pleural effusion. 5. Cholelithiasis. Electronically Signed   By: Gaylyn Rong M.D.   On: 08/09/2021 13:43    Anti-infectives: Anti-infectives (From admission, onward)    Start     Dose/Rate Route Frequency Ordered Stop   08/07/21 0200  piperacillin-tazobactam (ZOSYN) IVPB 3.375 g        3.375 g 12.5 mL/hr over 240 Minutes Intravenous Every 8 hours 08/06/21 1958     08/06/21 1800  piperacillin-tazobactam (ZOSYN) IVPB 3.375 g  Status:  Discontinued        3.375 g 12.5 mL/hr over 240 Minutes Intravenous Every 6 hours 08/06/21 1723 08/06/21 1957        Assessment/Plan Crohn's colitis with 3 cm pericolonic abscess - CT 6/3 with above findings - IR did not feel collection was accessible at this time, recommend repeat CT in a few days if not improving with medical management of Crohn's  and abscess - repeat CT yesterday with stable collection  - GI following and started mesalamine  - no leukocytosis, afebrile and no concern for obstruction at this time  - defer to GI on medical management of Crohn's and related abscess, would not recommend surgical intervention acutely  - stable for discharge on PO abx from a surgical standpoint, would recommend total course of 14 days abx. Recommend outpatient CT in 5-7 days with GI follow up. Patient can follow up with [facility] PRN but no surgical needs currently   FEN: soft diet, IVF@75  cc/h VTE: LMWH ID: Zosyn 6/3>>  LOS: 4 days   I reviewed Consultant GI notes, hospitalist notes, last 24 h vitals and pain scores, last 48 h intake and output, last 24 h labs and trends, and last 24 h imaging results.    Juliet Rude, [facility] 08/10/2021, 10:33 AM Please see Amion for pager number during day hours 7:00am-4:30pm

## 2021-08-10 NOTE — Progress Notes (Signed)
[facility] Progress Note  Laura Hebert 46 y.o. 1975/09/28  CC:  Intra-abdominal abscess   Subjective: Patient doing well today, husband at bedside.  Had some gas pains and stomach discomfort following oral contrast for CT scan.  Otherwise tolerating diet, having normal bowel movements without melena or hematochezia.  Pain improved today.  Would like to go home.  ROS : Review of Systems  Constitutional:  Negative for chills and fever.  Gastrointestinal:  Positive for abdominal pain. Negative for blood in stool, constipation, diarrhea, heartburn, melena, nausea and vomiting (LUQ).  Genitourinary:  Negative for dysuria and urgency.  Musculoskeletal:  Negative for falls.     Objective: Vital signs in last 24 hours: Vitals:   08/09/21 2147 08/10/21 0600  BP: 128/76 123/76  Pulse: 84 87  Resp: 16 18  Temp: 98.8 F (37.1 C) 98.9 F (37.2 C)  SpO2: 97% 95%    Physical Exam:  General:  Alert, cooperative, no distress, appears stated age  Head:  Normocephalic, without obvious abnormality, atraumatic  Eyes:  Anicteric sclera, EOM's intact  Lungs:   Clear to auscultation bilaterally, respirations unlabored  Heart:  Regular rate and rhythm, S1, S2 normal  Abdomen:   Soft, minimally tender to palpation of left lower quadrant, bowel sounds active all four quadrants,  no masses,   Extremities: Extremities normal, atraumatic, no  edema  Pulses: 2+ and symmetric    Lab Results: Recent Labs    08/09/21 0506  NA 136  K 3.5  CL 105  CO2 25  GLUCOSE 86  BUN <5*  CREATININE 0.90  CALCIUM 8.2*   No results for input(s): AST, ALT, ALKPHOS, BILITOT, PROT, ALBUMIN in the last 72 hours. Recent Labs    08/09/21 0506 08/10/21 0413  WBC 5.1 6.3  HGB 7.7* 8.1*  HCT 26.0* 26.5*  MCV 79.5* 79.1*  PLT 331 351   Recent Labs    08/08/21 0325  LABPROT 14.4  INR 1.1      Assessment 3 x 2.8 cm pericolonic abscess with left-sided colitis   History of Crohn's  disease, diagnosed at age 36, previously on Asacol and Remicade, without any medication for Crohn's for at least 9 years.   Normocytic anemia, hemoglobin 8.1 (7.7), platelet 351, MCV 79.1 Low albumin 2.7   Iron 16, ferritin 25  Repeat CT abdomen pelvis 08/09/2021 showed no significant change in size of abscess, no true free intraperitoneal gas loose in the abdomen.  No wall thickening of the proximal colon.   Plan: On soft diet. Okay to advance diet as tolerated. Hopefully transition to oral antibiotics today. Stable for discharge from GI standpoint. We will arrange outpatient follow-up with Dr. Marca Ancona for management of Crohn's disease in the next 2 weeks. Continue mesalamine outpatient. Continue supportive care.  [facility] will follow.  Berdine Dance PA-C 08/10/2021, 10:48 AM  Contact #  [phone]

## 2021-08-10 NOTE — Plan of Care (Signed)
Discharge instructions given to the patient including medications and follow up.  

## 2021-08-18 ENCOUNTER — Ambulatory Visit
Admission: RE | Admit: 2021-08-18 | Discharge: 2021-08-18 | Disposition: A | Discharge status: Home / Self Care | Payer: Managed Care, Other (non HMO) | Source: Ambulatory Visit | Attending: Gastroenterology | Admitting: Gastroenterology

## 2021-08-18 ENCOUNTER — Other Ambulatory Visit: Payer: Self-pay | Admitting: Gastroenterology

## 2021-08-18 DIAGNOSIS — K50114 Crohn's disease of large intestine with abscess: Secondary | ICD-10-CM

## 2021-08-18 MED ORDER — IOPAMIDOL (ISOVUE-300) INJECTION 61%
100.0000 mL | Freq: Once | INTRAVENOUS | Status: AC | PRN
  Administered 2021-08-18: 100 mL via INTRAVENOUS

## 2022-11-28 ENCOUNTER — Other Ambulatory Visit: Payer: Self-pay | Admitting: Gastroenterology

## 2022-11-28 DIAGNOSIS — K5090 Crohn's disease, unspecified, without complications: Secondary | ICD-10-CM

## 2022-11-28 DIAGNOSIS — K624 Stenosis of anus and rectum: Secondary | ICD-10-CM

## 2022-12-14 ENCOUNTER — Ambulatory Visit
Admission: RE | Admit: 2022-12-14 | Discharge: 2022-12-14 | Disposition: A | Discharge status: Home / Self Care | Payer: Managed Care, Other (non HMO) | Source: Ambulatory Visit | Attending: Gastroenterology | Admitting: Gastroenterology

## 2022-12-14 DIAGNOSIS — K5090 Crohn's disease, unspecified, without complications: Secondary | ICD-10-CM

## 2022-12-14 DIAGNOSIS — K624 Stenosis of anus and rectum: Secondary | ICD-10-CM

## 2022-12-14 MED ORDER — IOPAMIDOL (ISOVUE-300) INJECTION 61%
200.0000 mL | Freq: Once | INTRAVENOUS | Status: AC | PRN
  Administered 2022-12-14: 100 mL via INTRAVENOUS

## 2023-03-20 IMAGING — CT CT ABD-PELV W/ CM
2 of 4 series · 14 of 46 positions shown, 16 images · IV contrast (APPLIED)
Comparison: 08/06/2021

CLINICAL DATA: Crohn's disease with paracolic abscess

EXAM:
CT ABDOMEN AND PELVIS WITH CONTRAST
TECHNIQUE: Multidetector CT imaging of the abdomen and pelvis was performed
using the standard protocol following bolus administration of
intravenous contrast.

[Series 2: axial st · axial · 0.81mm/px · z∈[-665,-230]mm · 11 of 99 slices shown, 13 images]
[im 6/99  soft-tissue]
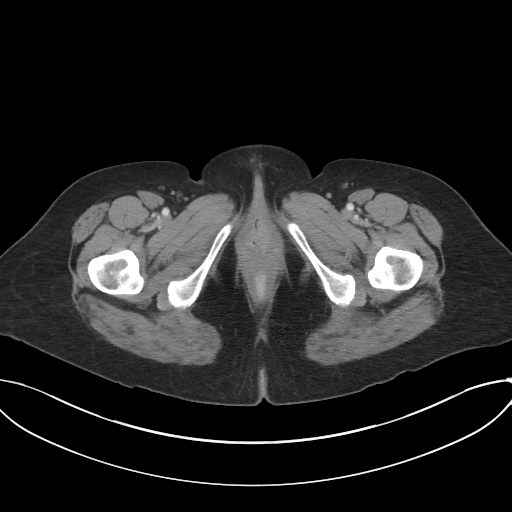
[im 6/99  bone]
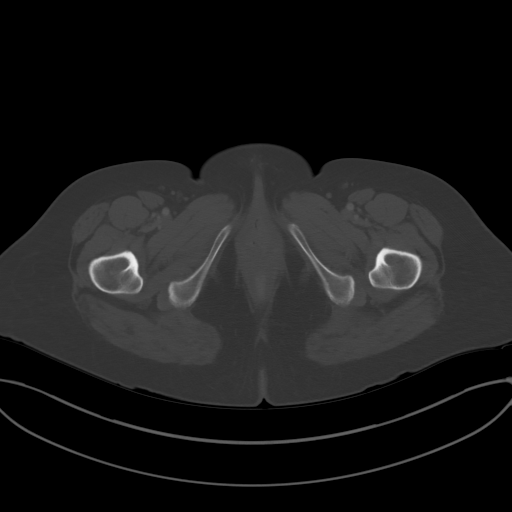
[im 17/99  soft-tissue]
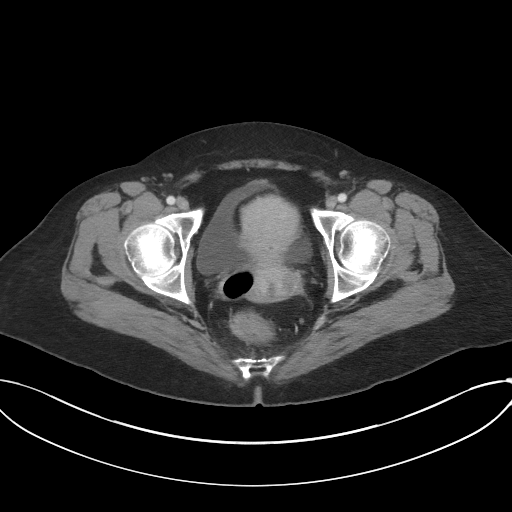
[im 22/99  soft-tissue]
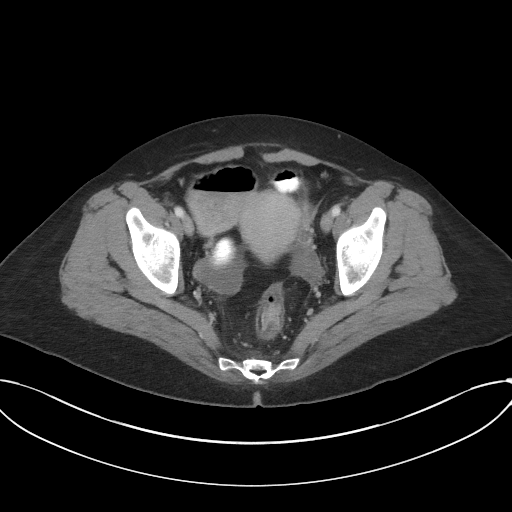
[im 33/99  soft-tissue]
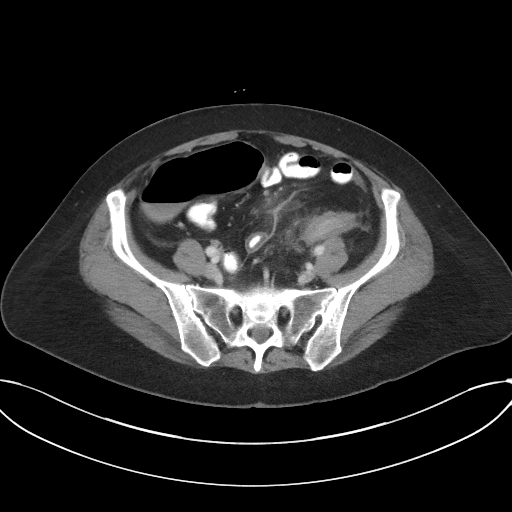
[im 39/99  soft-tissue]
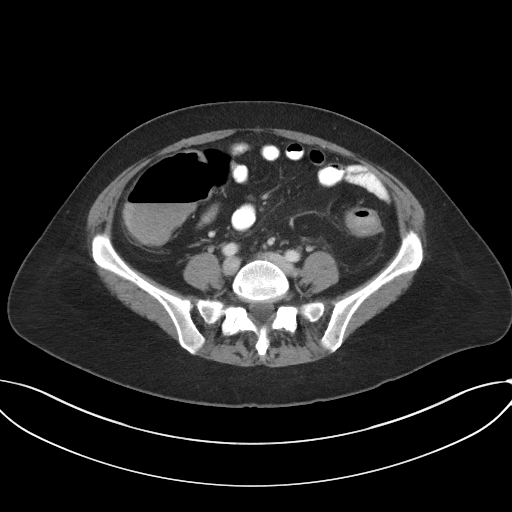
[im 50/99  soft-tissue]
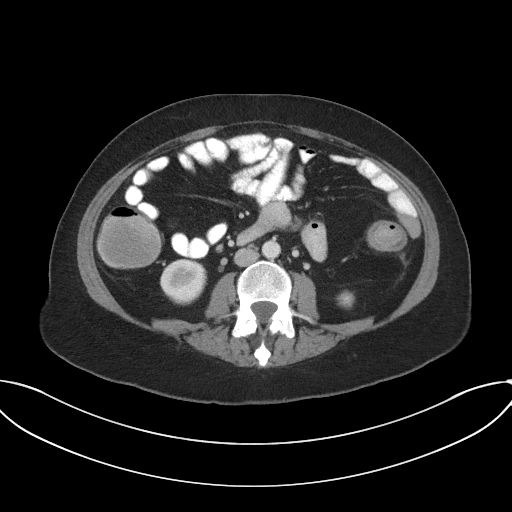
[im 60/99  soft-tissue]
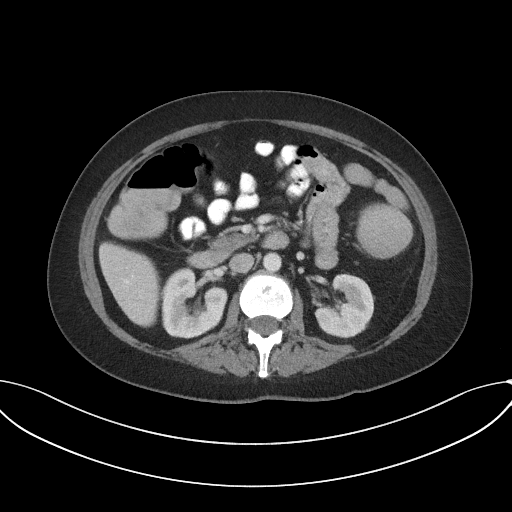
[im 66/99  soft-tissue]
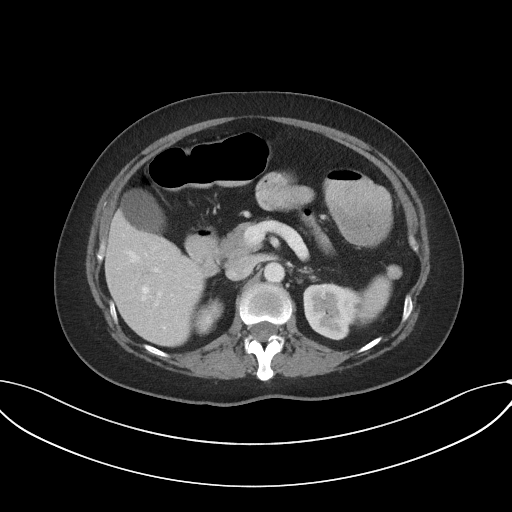
[im 77/99  soft-tissue]
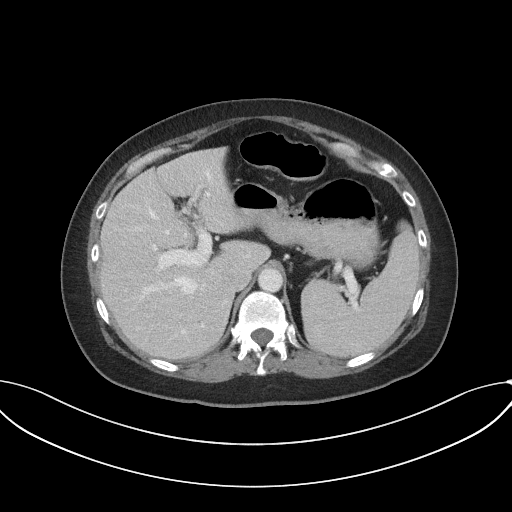
[im 77/99  bone]
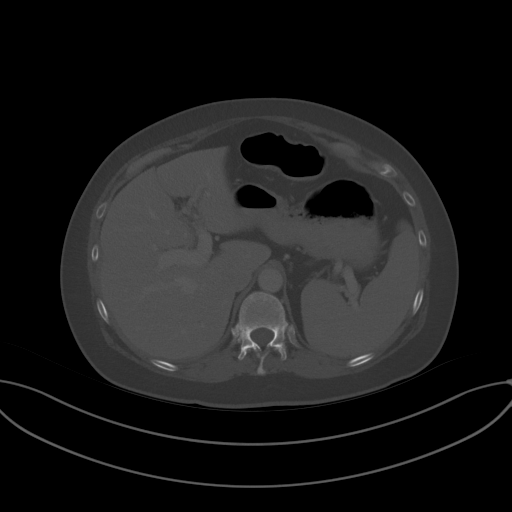
[im 82/99  soft-tissue]
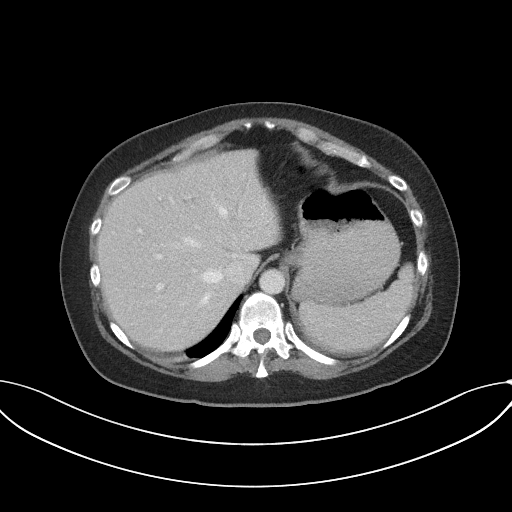
[im 93/99  soft-tissue]
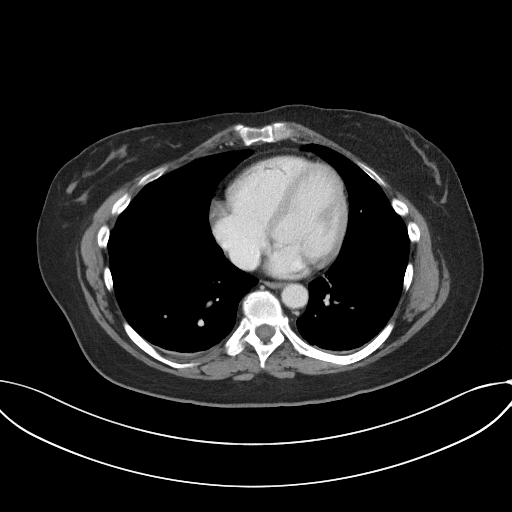

[Series 5: coronal st · coronal · 0.68mm/px · 3 of 87 slices shown]
[im 29/87  soft-tissue]
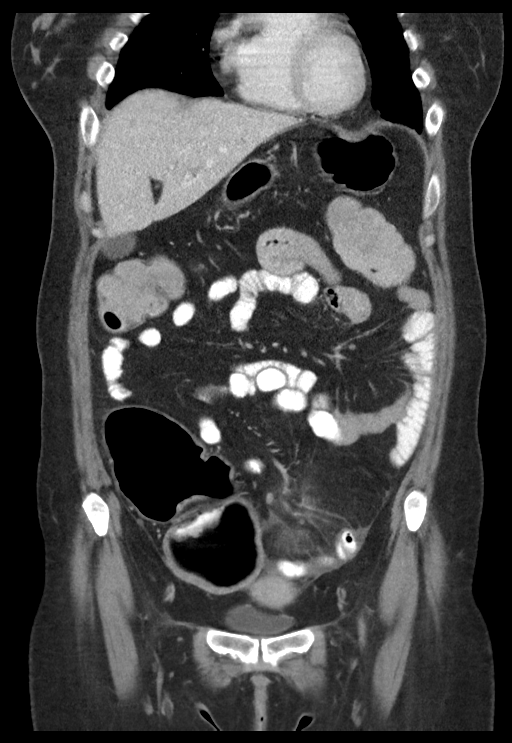
[im 39/87  soft-tissue]
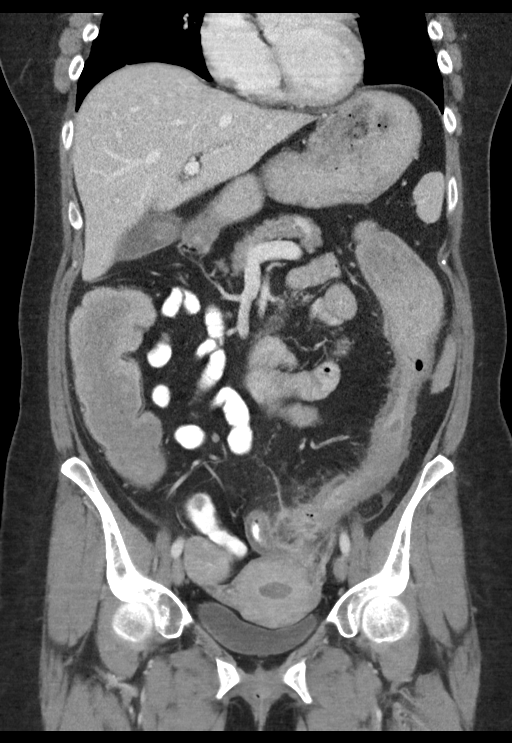
[im 48/87  soft-tissue]
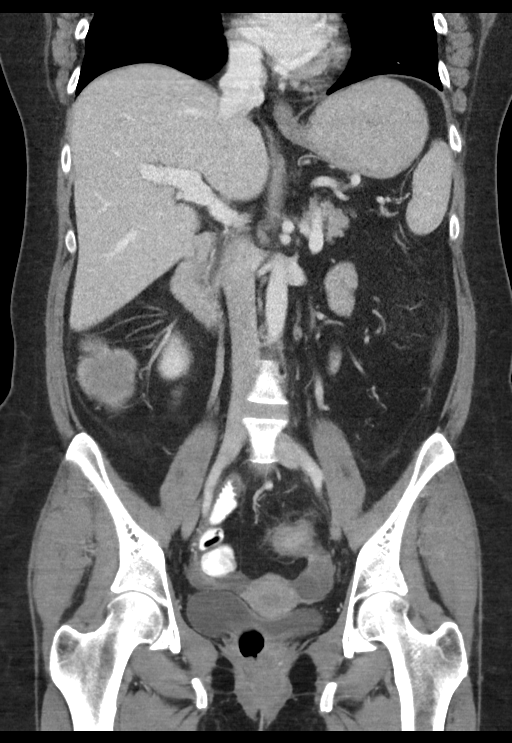

[14 of 46 positions shown; findings below may reference images not displayed]

RADIATION DOSE REDUCTION: This exam was performed according to the
departmental dose-optimization program which includes automated
exposure control, adjustment of the mA and/or kV according to
patient size and/or use of iterative reconstruction technique.

CONTRAST:  100mL OMNIPAQUE IOHEXOL 300 MG/ML  SOLN
FINDINGS: Lower chest: Trace right pleural effusion.

Hepatobiliary: 2.4 cm gallstone in the gallbladder. No biliary
dilatation observed.

Pancreas: Unremarkable

Spleen: The spleen measures 12.4 by 5.3 by 11.3 cm (volume = 390
cm^3), upper normal size. No focal splenic abnormality.

Adrenals/Urinary Tract: Unremarkable

Stomach/Bowel: Interposed between the substantially thickened wall
of the sigmoid colon and the substantially thickened adjacent loop
of small bowel, a 2.9 by 2.3 by 2.8 cm (volume = 9.8 cm^3)
collection of gas and fluid is present compatible with abscess. By
my measurement this was previously 2.5 by 2.8 by 2.1 cm (volume =
7.7 cm^3). There is additional localized extraluminal gas and
fluid extending from the sigmoid colon towards this abscess. The
adjacent distal ileal loop demonstrates circumferential wall
thickening and some polypoid filling defects which could be
inflammatory. The sigmoid colon demonstrates diffuse circumferential
wall thickening. Definite continuity of the small bowel with the
abscess is not demonstrated, and I would tend to favor that the
small bowel is either secondarily inflamed (most likely) or a skip
lesion but that the sigmoid colon is likely the origin of the
abscess.

Scattered air-levels in the rest of the mildly distended colon.
There is fat in the wall of the rectum which could be from chronic
inflammation. No overtly dilated small bowel.

Vascular/Lymphatic: Unremarkable

Reproductive: Questionable trace fluid adjacent to the right ovary.

Other: No supplemental non-categorized findings.

Musculoskeletal: Schmorl's nodes at the L2-3 endplates.
IMPRESSION: 1. Roughly similar size of the left lower quadrant abscess
interposed between a thickened loop of distal ileum and the
thickened sigmoid colon. The abscess appears most closely associated
with the sigmoid colon loop, with small additional extraluminal
locules of gas and fluid tracking between the sigmoid colon and this
abscess. All of the extraluminal gas appears locally contained, with
no true free intraperitoneal gas loose in the abdomen. The
involvement of the distal ileal loop could be secondary inflammation
due to the adjacent abscess, or a skip lesion.
2. There also scattered air-fluid levels in the more proximal colon
mild prominence of the caliber of the more proximal colon, although
no wall thickening in the proximal colon.
3. Potential trace free fluid adjacent to the right ovary.
4. Trace right pleural effusion.
5. Cholelithiasis.

## 2023-03-29 IMAGING — CT CT ABD-PELV W/ CM
2 of 4 series · 15 of 46 positions shown, 17 images · IV contrast (agent unspecified)
Comparison: 08/09/2021

CLINICAL DATA: Crohn's disease of large intestine with abscess

EXAM:
CT ABDOMEN AND PELVIS WITH CONTRAST
TECHNIQUE: Multidetector CT imaging of the abdomen and pelvis was performed
using the standard protocol following bolus administration of
intravenous contrast.

[Series 2: abd pelvis 5.00 br40 s3 axial · axial · 0.71mm/px · z∈[+1131,+1561]mm · 12 of 94 slices shown, 14 images]
[im 4/94  soft-tissue]
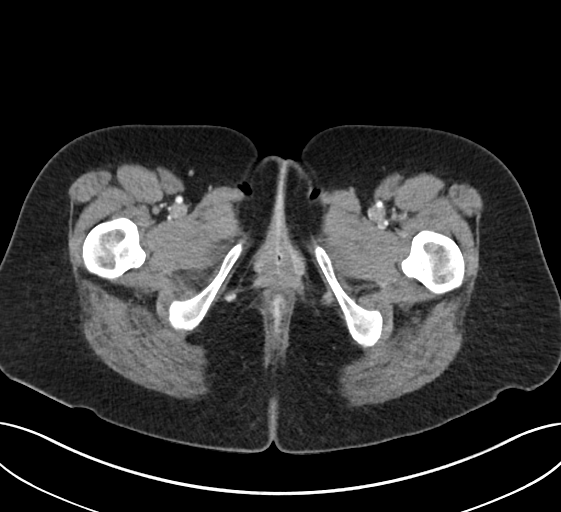
[im 4/94  bone]
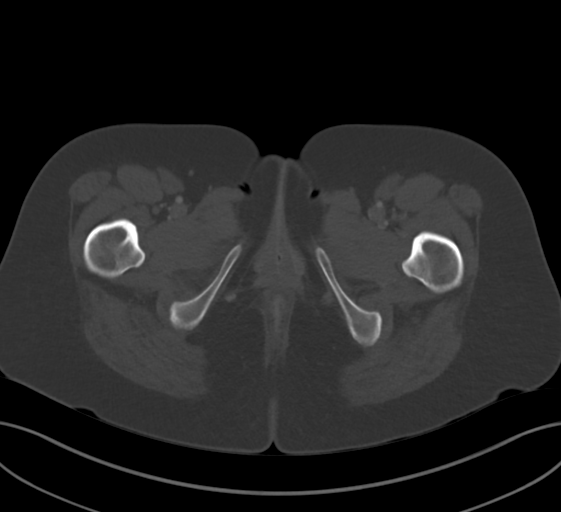
[im 12/94  soft-tissue]
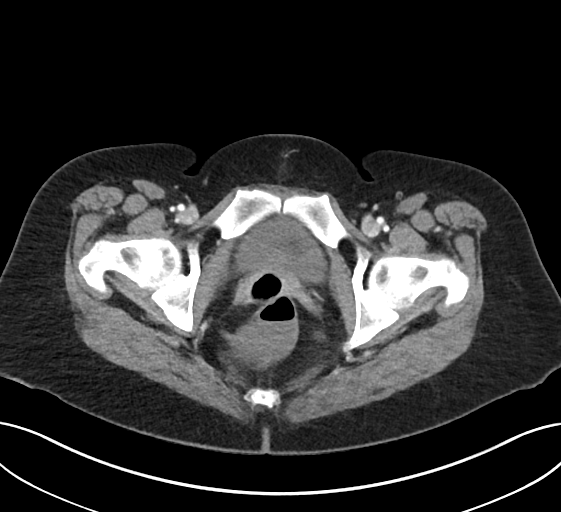
[im 20/94  soft-tissue]
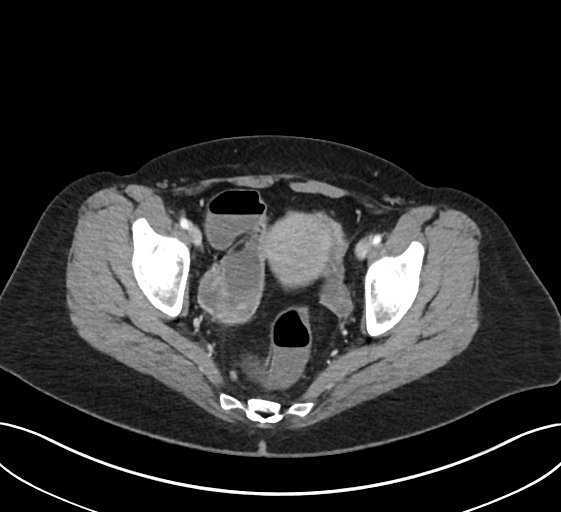
[im 28/94  soft-tissue]
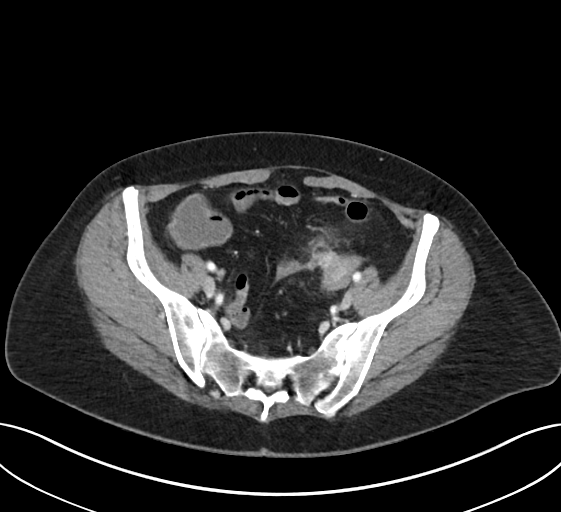
[im 35/94  soft-tissue]
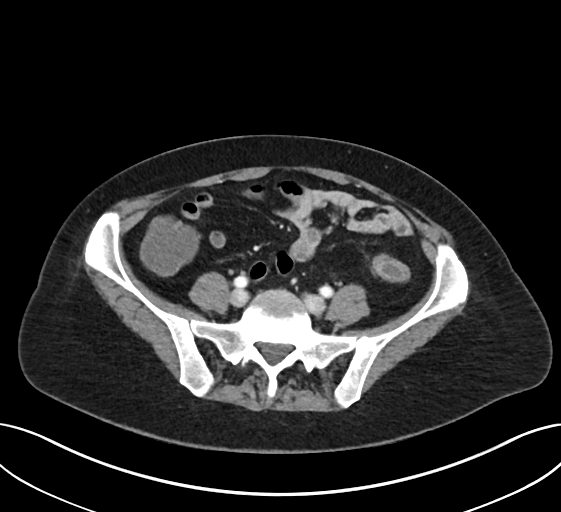
[im 43/94  soft-tissue]
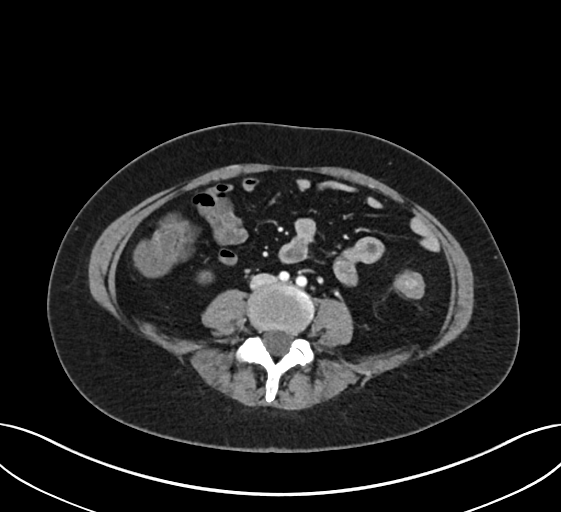
[im 51/94  soft-tissue]
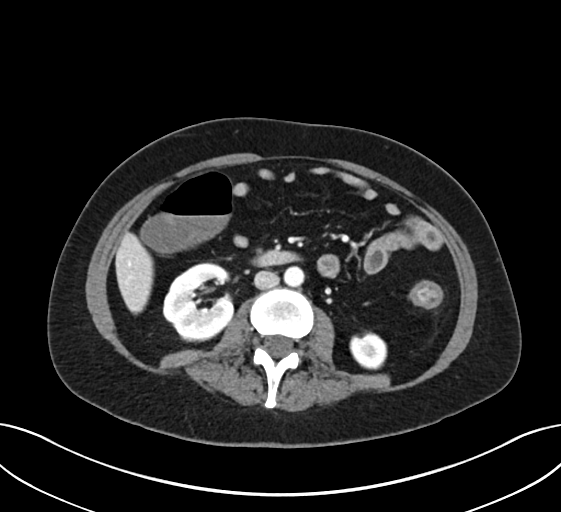
[im 59/94  soft-tissue]
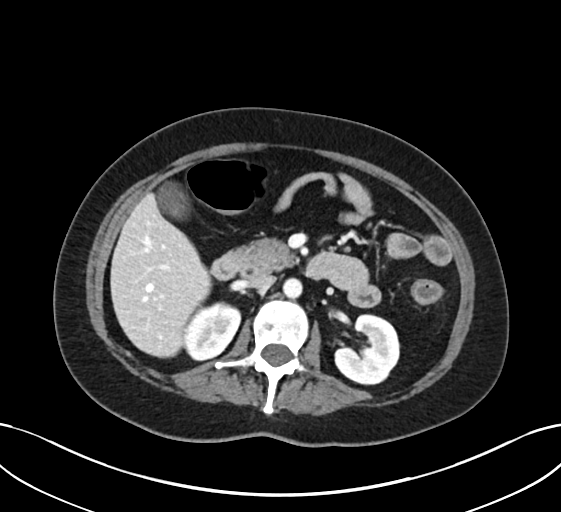
[im 66/94  soft-tissue]
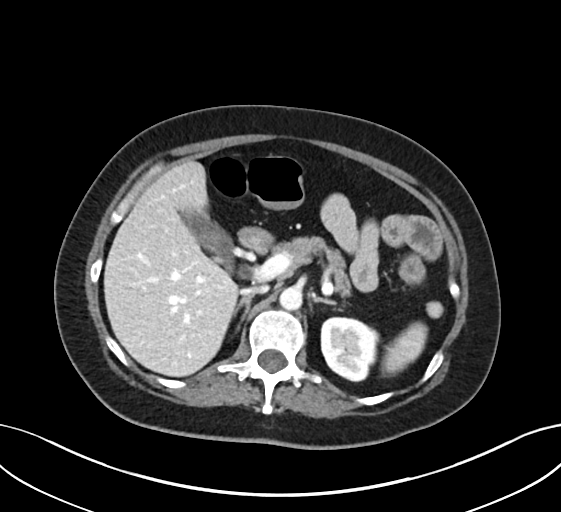
[im 66/94  bone]
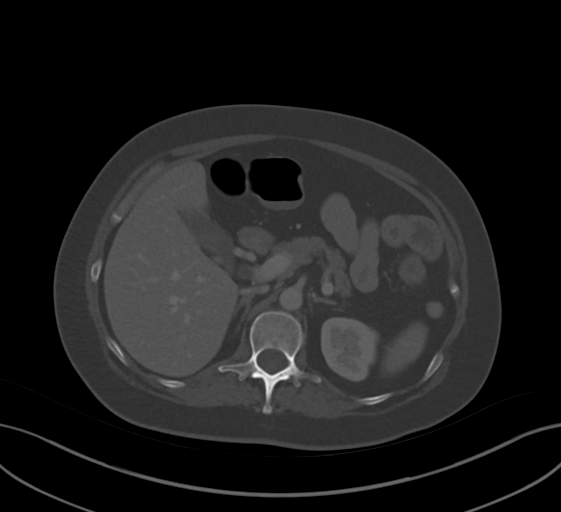
[im 74/94  soft-tissue]
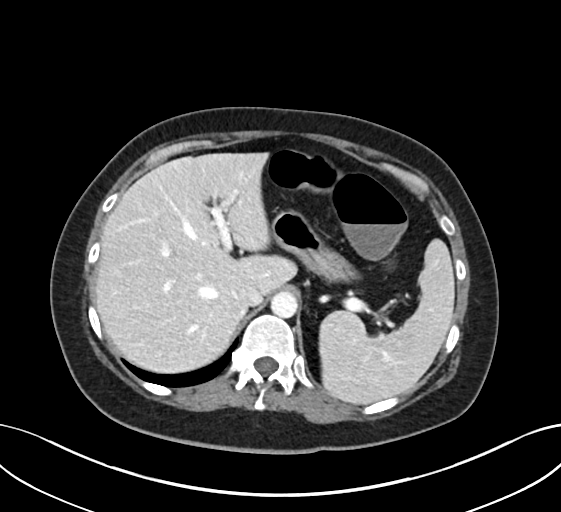
[im 82/94  soft-tissue]
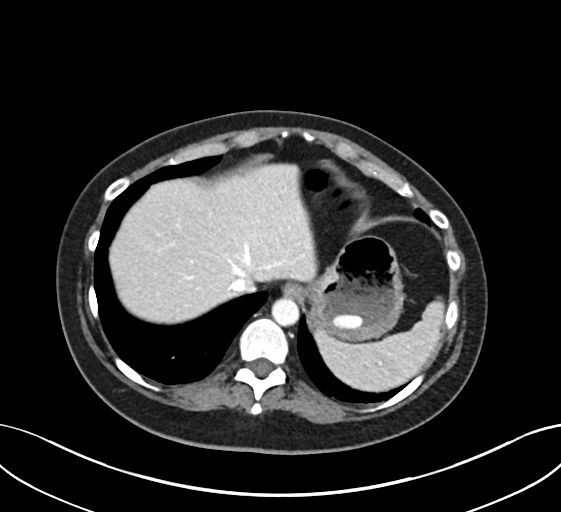
[im 90/94  soft-tissue]
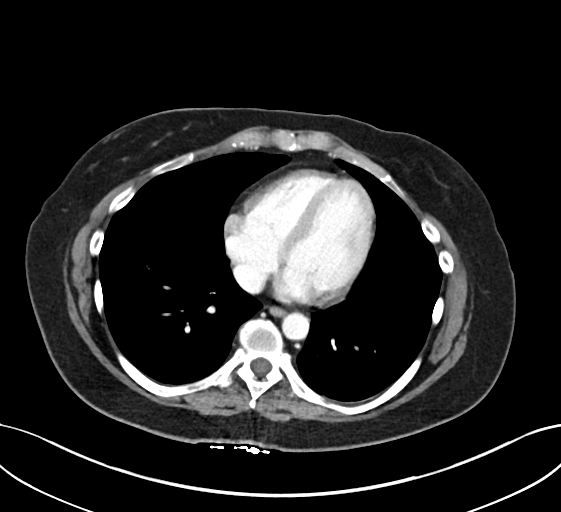

[Series 6: abd pelvis 2.00 br40 s3 cor · coronal · 0.78mm/px · 3 of 137 slices shown]
[im 46/137  soft-tissue]
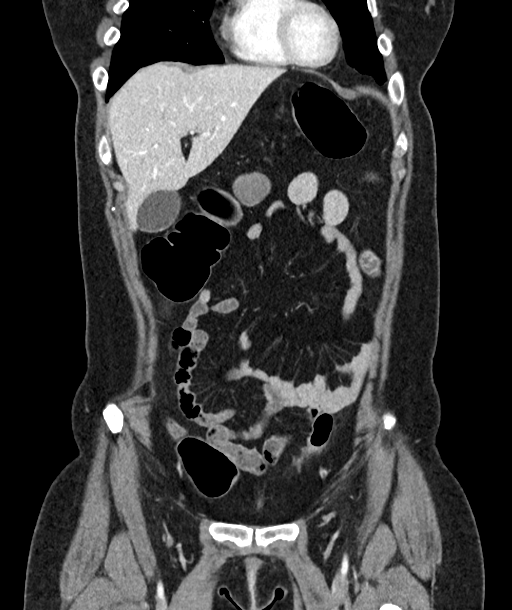
[im 61/137  soft-tissue]
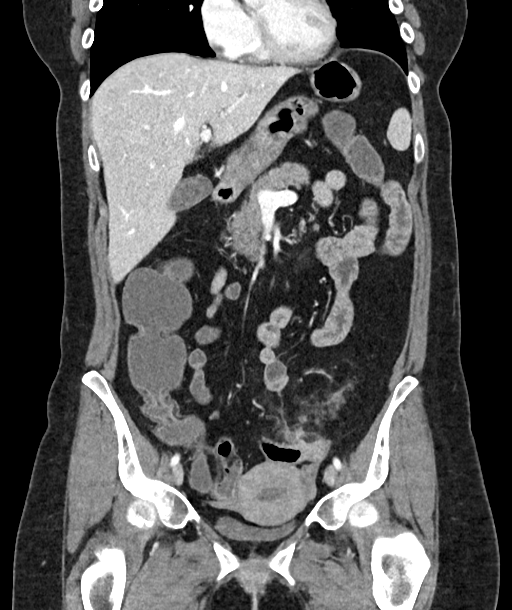
[im 76/137  soft-tissue]
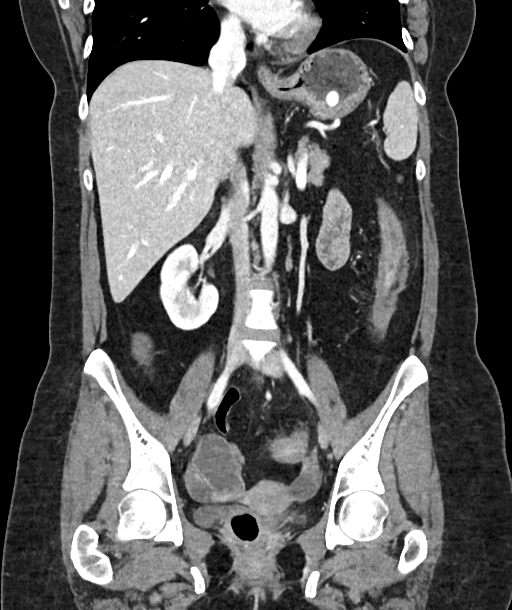

[15 of 46 positions shown; findings below may reference images not displayed]

RADIATION DOSE REDUCTION: This exam was performed according to the
departmental dose-optimization program which includes automated
exposure control, adjustment of the mA and/or kV according to
patient size and/or use of iterative reconstruction technique.

CONTRAST:  100mL [id number] IOPAMIDOL ([id number]) INJECTION 61%
FINDINGS: Lower chest: Lung bases are clear.

Hepatobiliary: Liver parenchyma is normal. Gallstones present within
the gallbladder but no CT evidence of cholecystitis or obstruction.

Pancreas: Normal

Spleen: Normal

Adrenals/Urinary Tract: Adrenal glands are normal. Kidneys are
normal. No cyst, mass, stone or hydronephrosis. Bladder is normal.

Stomach/Bowel: Stomach is normal. Wall thickening of the small
intestine consistent with Crohn's disease as seen previously. Wall
thickening also of the left colon. Imaging improvement with
resolving paracolic abscess. There is stranding in that region now
without a measurable residual collection. No new or worsening
finding.

Vascular/Lymphatic: Aorta and IVC are normal.  No adenopathy.

Reproductive: No pelvic mass.

Other: No free fluid or air.

Musculoskeletal: Negative
IMPRESSION: Imaging improvement. Previously seen 3 cm abscess adjacent to the
sigmoid colon has resolved, there being some stranding in that area
but no measurable collection. Persistent wall thickening of the
colon and to a lesser extent in some areas of the small bowel.

Chololithiasis as seen previously without evidence of cholecystitis
or obstruction.

## 2023-07-16 ENCOUNTER — Other Ambulatory Visit: Payer: Self-pay | Admitting: Gastroenterology

## 2023-07-16 DIAGNOSIS — K50918 Crohn's disease, unspecified, with other complication: Secondary | ICD-10-CM

## 2023-07-16 DIAGNOSIS — D509 Iron deficiency anemia, unspecified: Secondary | ICD-10-CM

## 2023-07-25 ENCOUNTER — Ambulatory Visit
Admission: RE | Admit: 2023-07-25 | Discharge: 2023-07-25 | Disposition: A | Discharge status: Home / Self Care | Source: Ambulatory Visit | Attending: Gastroenterology | Admitting: Gastroenterology

## 2023-07-25 DIAGNOSIS — D509 Iron deficiency anemia, unspecified: Secondary | ICD-10-CM

## 2023-07-25 DIAGNOSIS — K50918 Crohn's disease, unspecified, with other complication: Secondary | ICD-10-CM

## 2023-07-25 MED ORDER — IOPAMIDOL (ISOVUE-300) INJECTION 61%
100.0000 mL | Freq: Once | INTRAVENOUS | Status: AC | PRN
  Administered 2023-07-25: 100 mL via INTRAVENOUS
